# Patient Record
Sex: Male | Born: 1970 | Race: White | Hispanic: No | Marital: Married | Smoking: Never smoker
Health system: Southern US, Community
[De-identification: ages and names within clinical notes are randomized; demographics above are authoritative.]

## PROBLEM LIST (undated history)

## (undated) DIAGNOSIS — I10 Essential (primary) hypertension: Secondary | ICD-10-CM

## (undated) DIAGNOSIS — I341 Nonrheumatic mitral (valve) prolapse: Secondary | ICD-10-CM

## (undated) DIAGNOSIS — F419 Anxiety disorder, unspecified: Secondary | ICD-10-CM

## (undated) HISTORY — DX: Anxiety disorder, unspecified: F41.9

## (undated) HISTORY — DX: Essential (primary) hypertension: I10

## (undated) HISTORY — PX: HERNIA REPAIR: SHX51

## (undated) HISTORY — DX: Nonrheumatic mitral (valve) prolapse: I34.1

---
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
## 2010-10-21 DIAGNOSIS — B370 Candidal stomatitis: Secondary | ICD-10-CM

## 2010-10-31 ENCOUNTER — Ambulatory Visit (INDEPENDENT_AMBULATORY_CARE_PROVIDER_SITE_OTHER): Payer: BC Managed Care – PPO | Admitting: Infectious Disease

## 2010-10-31 ENCOUNTER — Encounter: Payer: Self-pay | Admitting: Infectious Disease

## 2010-10-31 DIAGNOSIS — B370 Candidal stomatitis: Secondary | ICD-10-CM

## 2010-10-31 DIAGNOSIS — B372 Candidiasis of skin and nail: Secondary | ICD-10-CM

## 2010-10-31 DIAGNOSIS — I10 Essential (primary) hypertension: Secondary | ICD-10-CM

## 2010-10-31 DIAGNOSIS — R61 Generalized hyperhidrosis: Secondary | ICD-10-CM

## 2010-10-31 MED ORDER — FLUCONAZOLE 100 MG PO TABS: 100.0000 mg | ORAL_TABLET | Freq: Every day | ORAL | Status: AC

## 2010-10-31 NOTE — Assessment & Plan Note (Signed)
Again I wonder about undiagnosed malignancy and aberrant immune response because of this. Will check LDH. Watch closely if he has more systemic symptoms will have low thresh hold to do CT chest abdomen adn pelvis

## 2010-10-31 NOTE — Assessment & Plan Note (Signed)
Recurrent. Will test for HIV, ab and RNA. Will test immunoglobulins adn IgE, CD4 count, myeloperoxidase leve. I have sent fungal culture from tongue. . I wonder if he has an undiangosed malignancy given the history of night sweats, and I will check an LDH. I will give him a 14 day course of fluconazole with refills

## 2010-10-31 NOTE — Assessment & Plan Note (Signed)
See above. 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 See above assessement and plan

## 2010-10-31 NOTE — Progress Notes (Signed)
Subjective:    Patient ID: Jesse Stevenson, male    DOB: [birth year], 40 y.o.   MRN: [id number]  HPI 40 year old Caucasian male who [facility] white coating on tongue and was given magic mouth wash with resolution. Then in early August in [facility] again with thrush. Was seen at a Clinic who gave magic moutwash. The pt went to go see Dr. Patsy Lager. She rx clotrimazole troche. Then a few weeks ago had another recurrence of this a few weeks ago. He tested negative for HIV and apparently had unremarkable cbc and bmp. He has had a notable history of drenching night sweats for the past few months which he attributes to the type of mattress he had and the themostat setting.  He is married and in a monogamous relationship with his wife. He has children. 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 He has travelled to [place], and [place], [place]. HE has had no TB exposures. He has no history of IVDU or other illicti recreational drug use. IN all I spent over 60 minutes with the patient including greater than 50% of time in face to face counselling of the patient and in coordination of care.   Review of Systems  Constitutional: Positive for diaphoresis. Negative for fever, chills, activity change, appetite change, fatigue and unexpected weight change.  HENT: Positive for trouble swallowing. Negative for congestion, sore throat, rhinorrhea, sneezing and sinus pressure.   Eyes: Negative for photophobia and visual disturbance.  Respiratory: Negative for cough, chest tightness, shortness of breath, wheezing and stridor.   Cardiovascular: Negative for chest pain, palpitations and leg swelling.  Gastrointestinal: Negative for nausea, vomiting, abdominal pain, diarrhea, constipation, blood in stool, abdominal distention and anal bleeding.  Genitourinary: Negative for dysuria, hematuria, flank pain and difficulty urinating.  Musculoskeletal: Negative for  myalgias, back pain, joint swelling, arthralgias and gait problem.  Skin: Negative for color change, pallor, rash and wound.  Neurological: Negative for dizziness, tremors, weakness and light-headedness.  Hematological: Negative for adenopathy. Does not bruise/bleed easily.  Psychiatric/Behavioral: Negative for behavioral problems, confusion, sleep disturbance, dysphoric mood, decreased concentration and agitation.       Objective:   Physical Exam  Constitutional: He is oriented to person, place, and time. He appears well-developed and well-nourished. No distress.  HENT:  Head: Normocephalic and atraumatic.  Mouth/Throat: Oropharynx is clear and moist.         thrush  Eyes: Conjunctivae and EOM are normal. Pupils are equal, round, and reactive to light. No scleral icterus.  Neck: Normal range of motion. Neck supple. No JVD present.  Cardiovascular: Normal rate, regular rhythm and normal heart sounds.  Exam reveals no gallop and no friction rub.   No murmur heard. Pulmonary/Chest: Effort normal and breath sounds normal. No respiratory distress. He has no wheezes. He has no rales. He exhibits no tenderness.  Abdominal: He exhibits no distension and no mass. There is no tenderness. There is no rebound and no guarding.  Musculoskeletal: He exhibits no edema and no tenderness.  Lymphadenopathy:    He has no cervical adenopathy.  Neurological: He is alert and oriented to person, place, and time. He has normal reflexes. He exhibits normal muscle tone. Coordination normal.  Skin: Skin is warm and dry. He is not diaphoretic. No erythema. No pallor.  Psychiatric: He has a normal mood and affect. His behavior is normal. Judgment and thought content normal.  Assessment & Plan:  Oral thrush Recurrent. Will test for HIV, ab and RNA. Will test immunoglobulins adn IgE, CD4 count, myeloperoxidase leve. I have sent fungal culture from tongue. . I wonder if he has an undiangosed malignancy  given the history of night sweats, and I will check an LDH. I will give him a 14 day course of fluconazole with refills  Mucocutaneous candidiasis See above. 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 See above assessement and plan  Night sweats Again I wonder about undiagnosed malignancy and aberrant immune response because of this. Will check LDH. Watch closely if he has more systemic symptoms will have low thresh hold to do CT chest abdomen adn pelvis

## 2010-11-01 LAB — COMPLETE METABOLIC PANEL WITH GFR
AST: 13 U/L (ref 0–37)
Alkaline Phosphatase: 51 U/L (ref 39–117)
BUN: 18 mg/dL (ref 6–23)
Calcium: 9.5 mg/dL (ref 8.4–10.5)
Chloride: 102 mEq/L (ref 96–112)
Creat: 0.86 mg/dL (ref 0.50–1.35)
Total Bilirubin: 0.4 mg/dL (ref 0.3–1.2)

## 2010-11-01 LAB — HIV 1/2 CONFIRMATION

## 2010-11-01 LAB — CBC WITH DIFFERENTIAL/PLATELET
Eosinophils Relative: 1 % (ref 0–5)
Hemoglobin: 16 g/dL (ref 13.0–17.0)
Lymphocytes Relative: 21 % (ref 12–46)
Lymphs Abs: 1.3 10*3/uL (ref 0.7–4.0)
MCV: 89.2 fL (ref 78.0–100.0)
Platelets: 267 10*3/uL (ref 150–400)
RBC: 5.47 MIL/uL (ref 4.22–5.81)
WBC: 6.2 10*3/uL (ref 4.0–10.5)

## 2010-11-01 LAB — IGG, IGA, IGM
IgA: 137 mg/dL (ref 68–379)
IgG (Immunoglobin G), Serum: 745 mg/dL (ref 650–1600)
IgM, Serum: 66 mg/dL (ref 41–251)

## 2010-11-02 LAB — CULTURE, GROUP A STREP: Organism ID, Bacteria: NORMAL

## 2010-11-07 ENCOUNTER — Telehealth: Payer: Self-pay | Admitting: *Deleted

## 2010-11-07 NOTE — Telephone Encounter (Signed)
He wanted to know the results of the labs. I gave them to him except the hiv labs. To md to handle. I told him alll labs looked good of the ones that are back

## 2010-11-08 ENCOUNTER — Encounter: Payer: Self-pay | Admitting: *Deleted

## 2010-11-08 NOTE — Telephone Encounter (Signed)
He needs to come to his folowup appt

## 2010-11-08 NOTE — Telephone Encounter (Signed)
Home # is d/c'd. I called the work # & he does not work there. Sent a letter

## 2010-11-28 ENCOUNTER — Telehealth: Payer: Self-pay | Admitting: *Deleted

## 2010-11-28 NOTE — Telephone Encounter (Signed)
I sent him a letter asking him to call for an appt. Needed to see md to discuss results & labs that were not in when I first spoke with him. Wrong address as mail was returned. Will wait for him to call

## 2010-11-29 LAB — FUNGUS CULTURE W SMEAR: Smear Result: NONE SEEN

## 2011-03-14 ENCOUNTER — Encounter: Payer: Self-pay | Admitting: Family Medicine

## 2011-03-14 ENCOUNTER — Ambulatory Visit: Payer: BC Managed Care – PPO

## 2011-03-14 ENCOUNTER — Ambulatory Visit (INDEPENDENT_AMBULATORY_CARE_PROVIDER_SITE_OTHER): Payer: BC Managed Care – PPO | Admitting: Family Medicine

## 2011-03-14 DIAGNOSIS — B372 Candidiasis of skin and nail: Secondary | ICD-10-CM

## 2011-03-14 DIAGNOSIS — R509 Fever, unspecified: Secondary | ICD-10-CM

## 2011-03-14 DIAGNOSIS — R059 Cough, unspecified: Secondary | ICD-10-CM

## 2011-03-14 DIAGNOSIS — B370 Candidal stomatitis: Secondary | ICD-10-CM

## 2011-03-14 DIAGNOSIS — I10 Essential (primary) hypertension: Secondary | ICD-10-CM

## 2011-03-14 DIAGNOSIS — R61 Generalized hyperhidrosis: Secondary | ICD-10-CM

## 2011-03-14 DIAGNOSIS — R05 Cough: Secondary | ICD-10-CM

## 2011-03-14 LAB — POCT CBC
Granulocyte percent: 72.8 %G (ref 37–80)
HCT, POC: 50.9 % (ref 43.5–53.7)
Hemoglobin: 16.4 g/dL (ref 14.1–18.1)
Lymph, poc: 0.7 (ref 0.6–3.4)
MCH, POC: 28.5 pg (ref 27–31.2)
MCHC: 32.2 g/dL (ref 31.8–35.4)
MCV: 88.4 fL (ref 80–97)
MID (cbc): 0.7 (ref 0–0.9)
MPV: 8.9 fL (ref 0–99.8)
POC Granulocyte: 3.8 (ref 2–6.9)
POC LYMPH PERCENT: 14.4 %L (ref 10–50)
POC MID %: 12.8 % — AB (ref 0–12)
Platelet Count, POC: 213 10*3/uL (ref 142–424)
RBC: 5.76 M/uL (ref 4.69–6.13)
RDW, POC: 13.2 %
WBC: 5.2 10*3/uL (ref 4.6–10.2)

## 2011-03-14 MED ORDER — DOXYCYCLINE HYCLATE 100 MG PO TABS: 100.0000 mg | ORAL_TABLET | Freq: Two times a day (BID) | ORAL | Status: AC

## 2011-03-14 MED ORDER — OSELTAMIVIR PHOSPHATE 75 MG PO CAPS: 75.0000 mg | ORAL_CAPSULE | Freq: Every day | ORAL | Status: AC

## 2011-03-14 NOTE — Progress Notes (Signed)
  Subjective:    Patient ID: Jesse Stevenson, male    DOB: [birth year], 41 y.o.   MRN: [id number]  Cough This is a chronic problem. The current episode started 1 to 4 weeks ago. The problem has been unchanged. The cough is non-productive. Associated symptoms include a fever, nasal congestion, postnasal drip, rhinorrhea, shortness of breath and sweats. Pertinent negatives include no chest pain, chills, ear congestion, ear pain, myalgias, rash or wheezing. The symptoms are aggravated by nothing.  Sore Throat  Associated symptoms include congestion, coughing and shortness of breath. Pertinent negatives include no ear pain.  Fever  Associated symptoms include congestion and coughing. Pertinent negatives include no chest pain, ear pain, rash or wheezing.  Chest Pain  Associated symptoms include a cough, dizziness, a fever and shortness of breath. Pertinent negatives include no back pain.      Review of Systems  Constitutional: Positive for fever and fatigue. Negative for chills.  HENT: Positive for congestion, rhinorrhea and postnasal drip. Negative for ear pain.   Respiratory: Positive for cough, chest tightness and shortness of breath. Negative for wheezing.   Cardiovascular: Negative for chest pain.  Genitourinary: Negative for dysuria, hematuria and flank pain.  Musculoskeletal: Negative for myalgias and back pain.  Skin: Negative for rash.  Neurological: Positive for dizziness. Negative for syncope.  Psychiatric/Behavioral: Negative for behavioral problems and agitation.       Objective:   Physical Exam  Constitutional: He is oriented to person, place, and time. He appears well-developed and well-nourished.  HENT:  Head: Normocephalic.  Eyes: EOM are normal. Pupils are equal, round, and reactive to light. Right eye exhibits no discharge. Left eye exhibits no discharge.  Neck: Normal range of motion. Neck supple.  Cardiovascular: Regular rhythm and normal heart sounds.  Exam  reveals no gallop and no friction rub.   No murmur heard. Pulmonary/Chest: Effort normal and breath sounds normal.  Abdominal: Soft. Bowel sounds are normal.  Musculoskeletal: Normal range of motion.  Neurological: He is alert and oriented to person, place, and time.  Skin: Skin is warm and dry.  Psychiatric: He has a normal mood and affect.          Assessment & Plan:  1. Fever-Pt was given Tamiflu ppx due to sone with + Flu B 2. Cough-Xray showed questionable density,since x 4 weeks will give Doxycycline 100 mg BID x 10 Days.  3. Hypotension with tachycardia-most likely related to dehydration with poor po since not feeling well and taking his BP meds today.  4. F/u in 1 week if no improvement, sooner if worse  [facility] reading (PRIMARY) by  Dr. Conley Rolls. Increased vascularature. Questionable right mid lobe density.

## 2011-03-14 NOTE — Patient Instructions (Signed)
Discharge Instructions Browse by Alphabet A B C D E F G H I J K L M N O P Q R S T U V W X Y Z  Browse by Category All Documents Allergy and Immunology Anesthesiology Bariatrics Bioterrorism Cardiology Critical Care Dentistry Dermatology Diabetes Dietary Easy-to-Read Emergency Medicine Endocrinology ENT Family Medicine Forms Gastroenterology Geriatrics Infectious Disease Internal Medicine Labs and Tests Neonatology Nephrology Neurology Obstetrics and Gynecology Oncology Ophthalmology Orthopedics Pediatrics Pharmacology Physical Medicine and Rehabilitation Podiatry Preventative Medicine Procedures Psychiatry Pulmonary Medicine Radiology Rheumatology Surgery Urology Drug Information Sheets All Drug Information Sheets  Browse by Alphabet A B C D E F G H I J K L M N O P Q R S T U V W X Y Z Discharge Instructions Browse by Alphabet A B C D E F G H I J K L M N O P Q R S T U V W X Y Z  Browse by Category All Documents Allergy and Immunology Anesthesiology [facility] Bioterrorism Cardiology Critical Care Dentistry Dermatology Diabetes Dietary Easy-to-Read Emergency Medicine Endocrinology ENT Family Medicine Forms Gastroenterology Geriatrics Infectious Disease Internal Medicine Labs and Tests Neonatology Nephrology Neurology Obstetrics and Gynecology Oncology Ophthalmology Orthopedics Pediatrics Pharmacology Physical Medicine and Rehabilitation Podiatry Preventative Medicine Procedures Psychiatry Pulmonary Medicine Radiology Rheumatology Surgery Urology Drug Information Sheets All Drug Information Sheets  Browse by Alphabet A B C D E F G H I J K L M N O P Q R S T U V W X Y Z

## 2011-03-16 ENCOUNTER — Telehealth: Payer: Self-pay

## 2011-03-16 NOTE — Telephone Encounter (Signed)
Pt call and states he was not feeling better and has congestion.  Pt did not get rx for doxy.  [facility] to [facility] up

## 2011-03-16 NOTE — Telephone Encounter (Addendum)
.  [facility] PT WAS SEEN THE OTHER DAY FOR THE FLU AND ISN'T MUCH BETTER. WOULD LIKE TO SPEAK WITH SOMEONE. PLEASE CALL [phone] HIS PHARMACY IS [facility] ON [address]

## 2011-06-20 ENCOUNTER — Encounter: Payer: Self-pay | Admitting: Family Medicine

## 2011-06-20 ENCOUNTER — Ambulatory Visit (INDEPENDENT_AMBULATORY_CARE_PROVIDER_SITE_OTHER): Payer: BC Managed Care – PPO | Admitting: Family Medicine

## 2011-06-20 VITALS — BP 110/90 | HR 97 | Temp 98.1°F | Resp 16 | Ht 76.0 in | Wt 187.0 lb

## 2011-06-20 DIAGNOSIS — M419 Scoliosis, unspecified: Secondary | ICD-10-CM | POA: Insufficient documentation

## 2011-06-20 DIAGNOSIS — Z0000 Encounter for general adult medical examination without abnormal findings: Secondary | ICD-10-CM

## 2011-06-20 DIAGNOSIS — M4120 Other idiopathic scoliosis, site unspecified: Secondary | ICD-10-CM

## 2011-06-20 DIAGNOSIS — I10 Essential (primary) hypertension: Secondary | ICD-10-CM

## 2011-06-20 DIAGNOSIS — R292 Abnormal reflex: Secondary | ICD-10-CM

## 2011-06-20 LAB — BASIC METABOLIC PANEL
BUN: 15 mg/dL (ref 6–23)
CO2: 29 mEq/L (ref 19–32)
Calcium: 10 mg/dL (ref 8.4–10.5)
Chloride: 100 mEq/L (ref 96–112)
Creat: 0.84 mg/dL (ref 0.50–1.35)
Glucose, Bld: 90 mg/dL (ref 70–99)
Potassium: 4.7 mEq/L (ref 3.5–5.3)
Sodium: 139 mEq/L (ref 135–145)

## 2011-06-20 LAB — POCT URINALYSIS DIPSTICK
Ketones, UA: NEGATIVE
Protein, UA: NEGATIVE
Spec Grav, UA: 1.02
Urobilinogen, UA: 0.2
pH, UA: 7.5

## 2011-06-20 LAB — T4, FREE: Free T4: 0.87 ng/dL (ref 0.80–1.80)

## 2011-06-20 LAB — TSH: TSH: 1.69 u[IU]/mL (ref 0.350–4.500)

## 2011-06-20 MED ORDER — LISINOPRIL-HYDROCHLOROTHIAZIDE 20-25 MG PO TABS: 1.0000 | ORAL_TABLET | Freq: Every day | ORAL | Status: DC

## 2011-06-20 MED ORDER — CARISOPRODOL 350 MG PO TABS: 350.0000 mg | ORAL_TABLET | Freq: Two times a day (BID) | ORAL | Status: DC | PRN

## 2011-06-20 MED ORDER — CARISOPRODOL 350 MG PO TABS: 350.0000 mg | ORAL_TABLET | Freq: Three times a day (TID) | ORAL | Status: DC | PRN

## 2011-06-20 NOTE — Progress Notes (Signed)
  Subjective:    Patient ID: Jesse Stevenson, male    DOB: [birth year], 41 y.o.   MRN: [id number]  HPI This Cauc male is here for annual CPE; he has HTN x 3 years and scoliosis with chronic  intermittent back pain and muscle spasms. He is married with 2 children, is a nonsmoker/ occasional  alcohol user and exercises sometimes. He works at a [employer], product/process development.    Last eye exam: 2-3 years ago (wears glasses)  Last dental exam: 03/2011   Review of Systems  Constitutional: Negative.   HENT: Negative.   Eyes: Negative.   Respiratory: Negative.   Cardiovascular: Negative.   Gastrointestinal: Negative.   Genitourinary: Negative.   Musculoskeletal: Positive for back pain.  Neurological: Negative.   Hematological: Negative.   Psychiatric/Behavioral: Negative.        Objective:   Physical Exam  Nursing note and vitals reviewed. Constitutional: He is oriented to person, place, and time. He appears well-developed and well-nourished. No distress.  HENT:  Head: Normocephalic and atraumatic.  Right Ear: External ear normal.  Left Ear: External ear normal.  Nose: Nose normal.  Mouth/Throat: Oropharynx is clear and moist.  Eyes: Conjunctivae and EOM are normal. Pupils are equal, round, and reactive to light. No scleral icterus.  Neck: Normal range of motion. Neck supple. No thyromegaly present.  Cardiovascular: Normal rate, regular rhythm, normal heart sounds and intact distal pulses.  Exam reveals no gallop and no friction rub.   No murmur heard. Pulmonary/Chest: Effort normal and breath sounds normal. No respiratory distress. He exhibits no tenderness.  Abdominal: Soft. Bowel sounds are normal. He exhibits no mass. There is tenderness. There is no rebound and no guarding. Hernia confirmed negative in the right inguinal area and confirmed negative in the left inguinal area.       Mild LUQ tenderness with moderate palpation  Genitourinary: Testes normal and penis normal. Right testis  shows no swelling and no tenderness. Right testis is descended. Left testis shows no swelling and no tenderness. Left testis is descended. Circumcised.  Musculoskeletal: Normal range of motion. He exhibits no edema and no tenderness.       Thoracic spine with mild curvature to right -xray (09/26/10) Shows scoliosis convex rightward, ~ 10 degrees  Lymphadenopathy:    He has no cervical adenopathy.       Right: No inguinal adenopathy present.       Left: No inguinal adenopathy present.  Neurological: He is alert and oriented to person, place, and time. He displays abnormal reflex. No cranial nerve deficit. He exhibits normal muscle tone.       DTRs upper ext: 3+/ brisk; lower ext 2+  Skin: Skin is warm and dry. No erythema.  Psychiatric: He has a normal mood and affect. His behavior is normal. Judgment and thought content normal.    ECG 2009 scanned into EMR Labs:  09/22/2010-  Cr= 0.84  LFTs normal    CBC normal            06/09/2010-  TChol= 196   TGs= 48 HDL= 75   LDL= 111     Assessment & Plan:   1. Routine general medical examination at a health care facility  Basic metabolic panel, Vitamin D, 25-hydroxy, POCT urinalysis dipstick  2. HTN (hypertension)  RF: Lisinopril-HCTZ   #90  With 3RFs RTC 4 months for HTN follow-up  3. Hyperreflexia  TSH, T4, Free  4. Scoliosis  RF  [place]

## 2011-06-20 NOTE — Patient Instructions (Signed)
Testicular Problems and Self-Exam Men can examine themselves easily and effectively with positive results. Monthly exams detect problems early and save lives. There are numerous causes of swelling in the testicle. Testicular cancer usually appears as a firm painless lump in the front part of the testicle. This may feel like a dull ache or heavy feeling located in the lower abdomen (belly), groin, or scrotum.  The risk is greater in men with undescended testicles and it is more common in young men. It is responsible for almost a fifth of cancers in males between ages 58 and 48. Other common causes of swellings, lumps, and testicular pain include injuries, inflammation (soreness) from infection, hydrocele, and torsion. These are a few of the reasons to do monthly self-examination of the testicles. The exam only takes minutes and could add years to your life. Get in the habit! SELF-EXAMINATION OF THE TESTICLES The testicles are easiest to examine after warm baths or showers and are more difficult to examine when you are cold. This is because the muscles attached to the testicles retract and pull them up higher or into the abdomen. While standing, roll one testicle between the thumb and forefinger. Feel for lumps, swelling, or discomfort. A normal testicle is egg shaped and feels firm. It is smooth and not tender. The spermatic cord can be felt as a firm spaghetti-like cord at the back of the testicle. It is also important to examine your groins. This is the crease between the front of your leg and your abdomen. Also, feel for enlarged lymph nodes (glands). Enlarged nodes are also a cause for you to see your caregiver for evaluation.  Self-examination of the testicles and groin areas on a regular basis will help you to know what your own testicles and groins feel like. This will help you pick up an abnormality (difference) at an earlier stage. Early discovery is the key to curing this cancer or treating other  conditions. Any lump, change, or swelling in the testicle calls for immediate evaluation by your caregiver. Cancer of the testicle does not result in impotence and it does not prevent normal intercourse or prevent having children. If your caregiver feels that medical treatment or chemotherapy could lead to infertility, sperm can be frozen for future use. It is necessary to see a caregiver as soon as possible after the discovery of a lump in a testicle. Document Released: 05/08/2000 Document Revised: 01/19/2011 Document Reviewed: 02/01/2008 ExitCare Patient Information 2012 ExitCare, [facility]     Hypertension As your heart beats, it forces blood through your arteries. This force is your blood pressure. If the pressure is too high, it is called hypertension (HTN) or high blood pressure. HTN is dangerous because you may have it and not know it. High blood pressure may mean that your heart has to work harder to pump blood. Your arteries may be narrow or stiff. The extra work puts you at risk for heart disease, stroke, and other problems.  Blood pressure consists of two numbers, a higher number over a lower, 110/72, for example. It is stated as "110 over 72." The ideal is below 120 for the top number (systolic) and under 80 for the bottom (diastolic). Write down your blood pressure today. You should pay close attention to your blood pressure if you have certain conditions such as:  Heart failure.   Prior heart attack.   Diabetes   Chronic kidney disease.   Prior stroke.   Multiple risk factors for heart disease.  To  see if you have HTN, your blood pressure should be measured while you are seated with your arm held at the level of the heart. It should be measured at least twice. A one-time elevated blood pressure reading (especially in the Emergency Department) does not mean that you need treatment. There may be conditions in which the blood pressure is different between your right and left arms. It is  important to see your caregiver soon for a recheck. Most people have essential hypertension which means that there is not a specific cause. This type of high blood pressure may be lowered by changing lifestyle factors such as:  Stress.   Smoking.   Lack of exercise.   Excessive weight.   Drug/tobacco/alcohol use.   Eating less salt.  Most people do not have symptoms from high blood pressure until it has caused damage to the body. Effective treatment can often prevent, delay or reduce that damage. TREATMENT  When a cause has been identified, treatment for high blood pressure is directed at the cause. There are a large number of medications to treat HTN. These fall into several categories, and your caregiver will help you select the medicines that are best for you. Medications may have side effects. You should review side effects with your caregiver. If your blood pressure stays high after you have made lifestyle changes or started on medicines,   Your medication(s) may need to be changed.   Other problems may need to be addressed.   Be certain you understand your prescriptions, and know how and when to take your medicine.   Be sure to follow up with your caregiver within the time frame advised (usually within two weeks) to have your blood pressure rechecked and to review your medications.   If you are taking more than one medicine to lower your blood pressure, make sure you know how and at what times they should be taken. Taking two medicines at the same time can result in blood pressure that is too low.  SEEK IMMEDIATE MEDICAL CARE IF:  You develop a severe headache, blurred or changing vision, or confusion.   You have unusual weakness or numbness, or a faint feeling.   You have severe chest or abdominal pain, vomiting, or breathing problems.  MAKE SURE YOU:   Understand these instructions.   Will watch your condition.   Will get help right away if you are not doing well or  get worse.  Document Released: 01/30/2005 Document Revised: 01/19/2011 Document Reviewed: 09/20/2007 [facility] Patient Information 2012 [place], [place].     Marland KitchenKeeping you healthy  Get these tests  Blood pressure- Have your blood pressure checked once a year by your healthcare provider.  Normal blood pressure is 120/80.  Weight- Have your body mass index (BMI) calculated to screen for obesity.  BMI is a measure of body fat based on height and weight. You can also calculate your own BMI at https://www.west-esparza.com/.  Cholesterol- Have your cholesterol checked regularly starting at age 10, sooner may be necessary if you have diabetes, high blood pressure, if a family member developed heart diseases at an early age or if you smoke.   Chlamydia, HIV, and other sexual transmitted disease- Get screened each year until the age of 53 then within three months of each new sexual partner.  Diabetes- Have your blood sugar checked regularly if you have high blood pressure, high cholesterol, a family history of diabetes or if you are overweight.  Get these vaccines  Flu shot-  Every fall.  Tetanus shot- Every 10 years.  Menactra- Single dose; prevents meningitis.  Take these steps  Don't smoke- If you do smoke, ask your healthcare provider about quitting. For tips on how to quit, go to www.smokefree.gov or call 1-800-QUIT-NOW.  Be physically active- Exercise 5 days a week for at least 30 minutes.  If you are not already physically active start slow and gradually work up to 30 minutes of moderate physical activity.  Examples of moderate activity include walking briskly, mowing the yard, dancing, swimming bicycling, etc.  Eat a healthy diet- Eat a variety of healthy foods such as fruits, vegetables, low fat milk, low fat cheese, yogurt, lean meats, poultry, fish, beans, tofu, etc.  For more information on healthy eating, go to www.thenutritionsource.org  Drink alcohol in moderation- Limit alcohol  intake two drinks or less a day.  Never drink and drive.  Dentist- Brush and floss teeth twice daily; visit your dentis twice a year.  Depression-Your emotional health is as important as your physical health.  If you're feeling down, losing interest in things you normally enjoy please talk with your healthcare provider.  Gun Safety- If you keep a gun in your home, keep it unloaded and with the safety lock on.  Bullets should be stored separately.  Helmet use- Always wear a helmet when riding a motorcycle, bicycle, rollerblading or skateboarding.  Safe sex- If you may be exposed to a sexually transmitted infection, use a condom  Seat belts- Seat bels can save your life; always wear one.  Smoke/Carbon Monoxide detectors- These detectors need to be installed on the appropriate level of your home.  Replace batteries at least once a year.  Skin Cancer- When out in the sun, cover up and use sunscreen SPF 15 or higher.  Violence- If anyone is threatening or hurting you, please tell your healthcare provider.

## 2011-06-25 NOTE — Progress Notes (Signed)
Quick Note:  Please call pt and advise that the following labs are abnormal...  All labs are normal except Vit D is a little low. Get an OTC Multivitamin for Men that has Vitamin D (One- A- Day or [employer] Made are good brands). Eat as healthy as possible to get most of your nutrients from foods.   Copy to pt. ______

## 2011-06-26 ENCOUNTER — Encounter: Payer: Self-pay | Admitting: Radiology

## 2011-06-28 ENCOUNTER — Encounter: Payer: Self-pay | Admitting: Family Medicine

## 2011-10-24 ENCOUNTER — Ambulatory Visit (INDEPENDENT_AMBULATORY_CARE_PROVIDER_SITE_OTHER): Payer: BC Managed Care – PPO | Admitting: Family Medicine

## 2011-10-24 ENCOUNTER — Encounter: Payer: Self-pay | Admitting: Family Medicine

## 2011-10-24 VITALS — BP 129/90 | HR 98 | Temp 98.0°F | Resp 16 | Ht 75.5 in | Wt 190.0 lb

## 2011-10-24 DIAGNOSIS — R002 Palpitations: Secondary | ICD-10-CM

## 2011-10-24 DIAGNOSIS — I10 Essential (primary) hypertension: Secondary | ICD-10-CM

## 2011-10-24 MED ORDER — CARISOPRODOL 350 MG PO TABS: 350.0000 mg | ORAL_TABLET | Freq: Three times a day (TID) | ORAL | Status: DC | PRN

## 2011-10-24 NOTE — Patient Instructions (Signed)

## 2011-10-24 NOTE — Progress Notes (Signed)
S:This 41 y.o. Cauc male has HTN. He has no adverse effects with medications. He reports occasional palpitations mid to late morning several days a week. He admits to inadequate fluid intake, especially water; he prefers to drink Gatorade  and has limited his caffeine intake. Otherwise, he feels well.  ROS: noncontributory.  O:  Filed Vitals:   10/24/11 1406  BP: 129/90  Pulse: 98  Temp: 98 F (36.7 C)  Resp: 16   GEN: In NAD; WN,WD. COR: RRR, no m,g,r. Heart rate is >80. LUNGS: Normal resp rate and effort. NEURO: Nonfocal.  A/P: 1. HTN (hypertension)   Continue Lisinopril-HCTZ 20-25 mg  1 tab daily.  2. Palpitations  Encouraged hydration with water in addition to other liquids.

## 2012-04-10 ENCOUNTER — Telehealth: Payer: Self-pay | Admitting: *Deleted

## 2012-04-10 NOTE — Telephone Encounter (Signed)
[facility] requesting refill on soma 350mg  to put on pt profile for the next fill in march

## 2012-04-11 MED ORDER — CARISOPRODOL 350 MG PO TABS: 350.0000 mg | ORAL_TABLET | Freq: Three times a day (TID) | ORAL | Status: DC | PRN

## 2012-04-11 NOTE — Telephone Encounter (Signed)
Soma refill e-prescribed to pt's pharmacy.

## 2012-04-12 ENCOUNTER — Other Ambulatory Visit: Payer: Self-pay | Admitting: Family Medicine

## 2012-04-12 MED ORDER — CARISOPRODOL 350 MG PO TABS: 350.0000 mg | ORAL_TABLET | Freq: Three times a day (TID) | ORAL | Status: DC | PRN

## 2012-06-20 ENCOUNTER — Other Ambulatory Visit: Payer: Self-pay | Admitting: Family Medicine

## 2012-07-19 ENCOUNTER — Other Ambulatory Visit: Payer: Self-pay | Admitting: Family Medicine

## 2012-08-13 ENCOUNTER — Other Ambulatory Visit: Payer: Self-pay | Admitting: Physician Assistant

## 2012-08-20 ENCOUNTER — Ambulatory Visit (INDEPENDENT_AMBULATORY_CARE_PROVIDER_SITE_OTHER): Payer: BC Managed Care – PPO | Admitting: Family Medicine

## 2012-08-20 ENCOUNTER — Encounter: Payer: Self-pay | Admitting: Family Medicine

## 2012-08-20 VITALS — BP 115/80 | HR 109 | Temp 98.1°F | Resp 16 | Ht 75.5 in | Wt 189.0 lb

## 2012-08-20 DIAGNOSIS — R000 Tachycardia, unspecified: Secondary | ICD-10-CM

## 2012-08-20 DIAGNOSIS — Z760 Encounter for issue of repeat prescription: Secondary | ICD-10-CM

## 2012-08-20 DIAGNOSIS — I498 Other specified cardiac arrhythmias: Secondary | ICD-10-CM

## 2012-08-20 DIAGNOSIS — I10 Essential (primary) hypertension: Secondary | ICD-10-CM

## 2012-08-20 MED ORDER — CARISOPRODOL 350 MG PO TABS: 350.0000 mg | ORAL_TABLET | Freq: Three times a day (TID) | ORAL | Status: DC | PRN

## 2012-08-20 MED ORDER — LISINOPRIL-HYDROCHLOROTHIAZIDE 20-25 MG PO TABS: 1.0000 | ORAL_TABLET | Freq: Every day | ORAL | Status: DC

## 2012-08-20 NOTE — Progress Notes (Signed)
S:  This 42 y.o. Cauc male has HTN. He is compliant w/ medications w/o adverse effects; he has been w/o medication for 2 days due to denial of refill. He has an elevated heart rate which he attributes to mild dehydration. He spends a lot of time outside; his son plays baseball. Pt acknowledges that his liquid intake is inadequate. He denies palpitations, CP or tightness, edema, SOB or DOE, fatigue, diaphoresis, HA, weakness, dizziness, numbness or syncope.  Patient Active Problem List   Diagnosis Date Noted  . Scoliosis of thoracic spine 06/20/2011  . Mucocutaneous candidiasis 10/31/2010  . HTN (hypertension) 10/31/2010  . Night sweats 10/31/2010  . Oral thrush 10/21/2010    PMHx, Soc Hx and Fam Hx reviewed.  ROS: As per HPI.  O:  Filed Vitals:   08/20/12 1421  BP: 115/80  Pulse: 109  Temp: 98.1 F (36.7 C)  Resp: 16   GEN: In NAD; WN,WD. COR: Sinus tachycardia. Normal s1 and S2. No m/g/r/. LUNGS: CTA; no wheezes or rhonchi. Normal resp effort. SKIN: Ruddy complexion. No rashes or pallor. MS: MAEs; no c/c/e. NEURO: A&Ox 3; CNs intact. Nonfocal.  A/P: HTN (hypertension)- Stable and controlled on current medication. No med change at this time.  Sinus tachycardia- Suspect dehydration. Encourage 48-60 ounces of fluid daily.  Issue of repeat prescriptions  Meds ordered this encounter  Medications  . lisinopril-hydrochlorothiazide (PRINZIDE,ZESTORETIC) 20-25 MG per tablet    Sig: Take 1 tablet by mouth daily.    Dispense:  90 tablet    Refill:  1  . DISCONTD: carisoprodol (SOMA) 350 MG tablet    Sig: Take 1 tablet (350 mg total) by mouth 3 (three) times daily as needed.    Dispense:  65 tablet    Refill:  5  . carisoprodol (SOMA) 350 MG tablet    Sig: Take 1 tablet (350 mg total) by mouth 3 (three) times daily as needed.    Dispense:  65 tablet    Refill:  5

## 2012-10-03 IMAGING — CR DG CHEST 2V
2 series · 2 of 2 positions shown · non-contrast
Comparison: 09/26/2010

CLINICAL DATA: Cough and fever

CHEST - 2 VIEW

[PA]
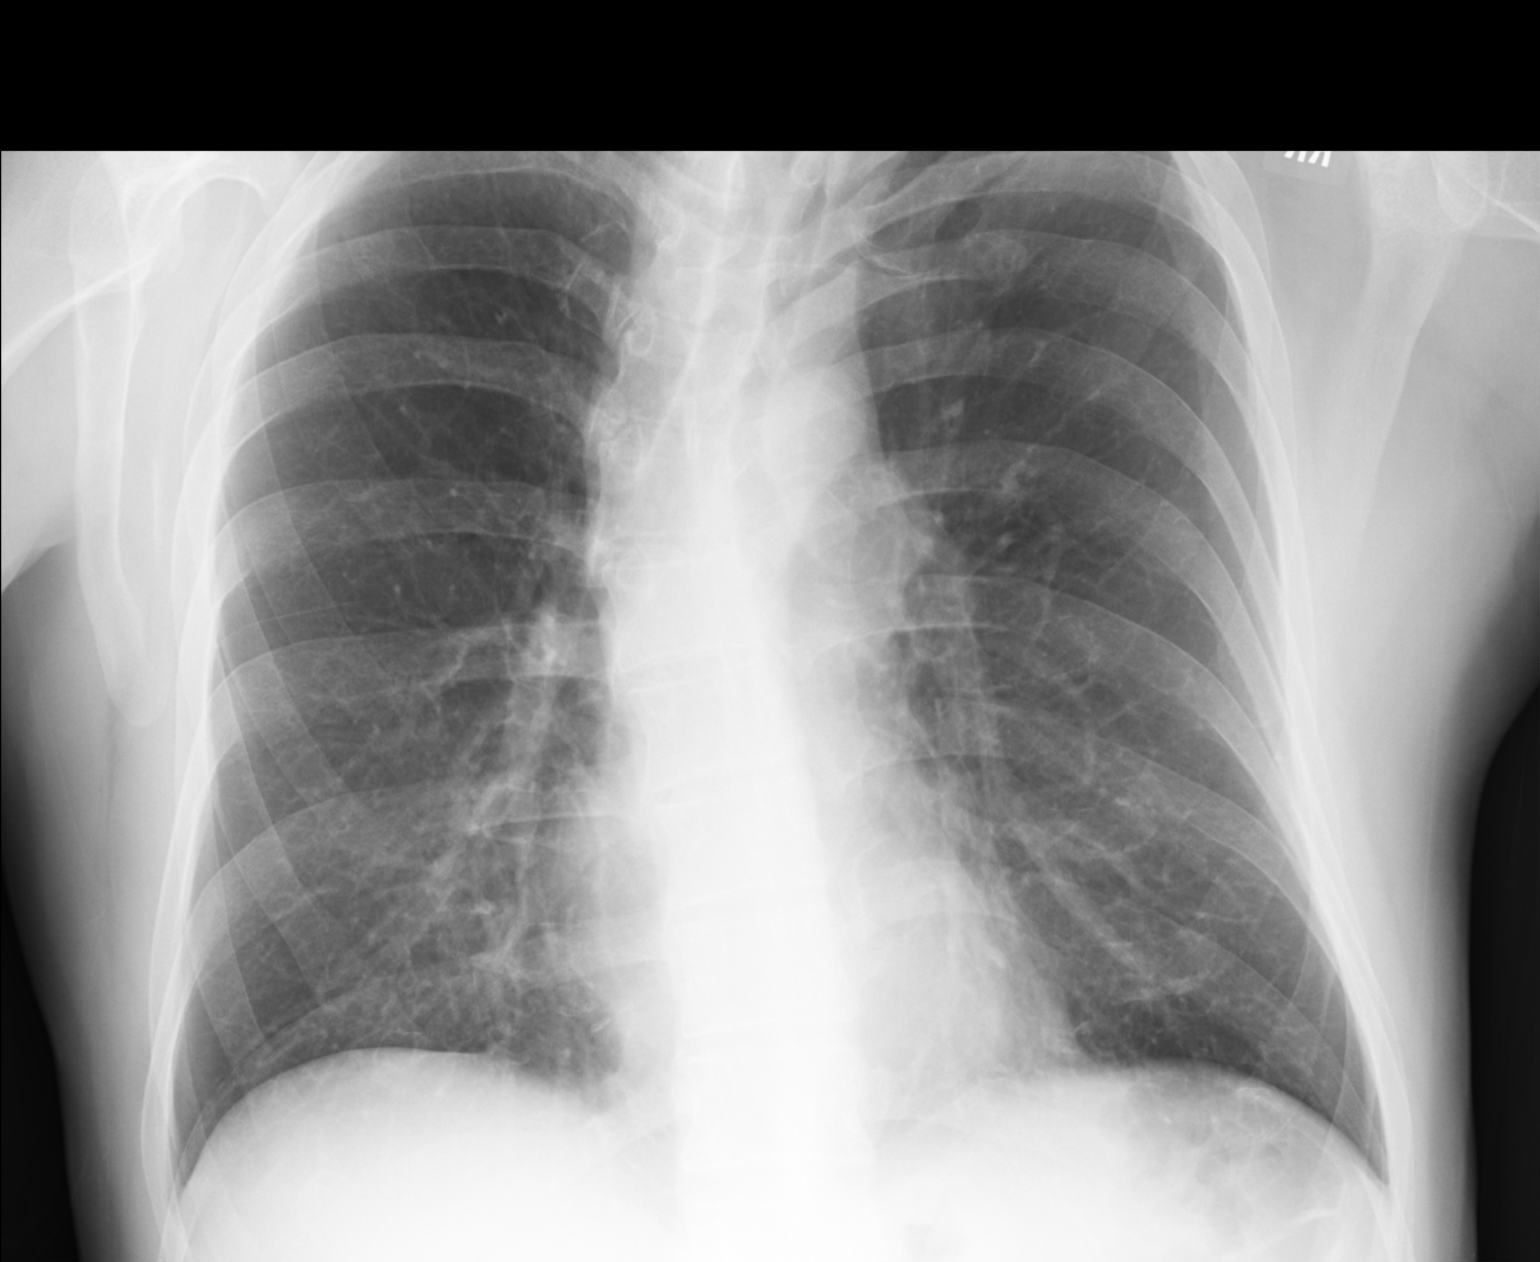

[lateral]
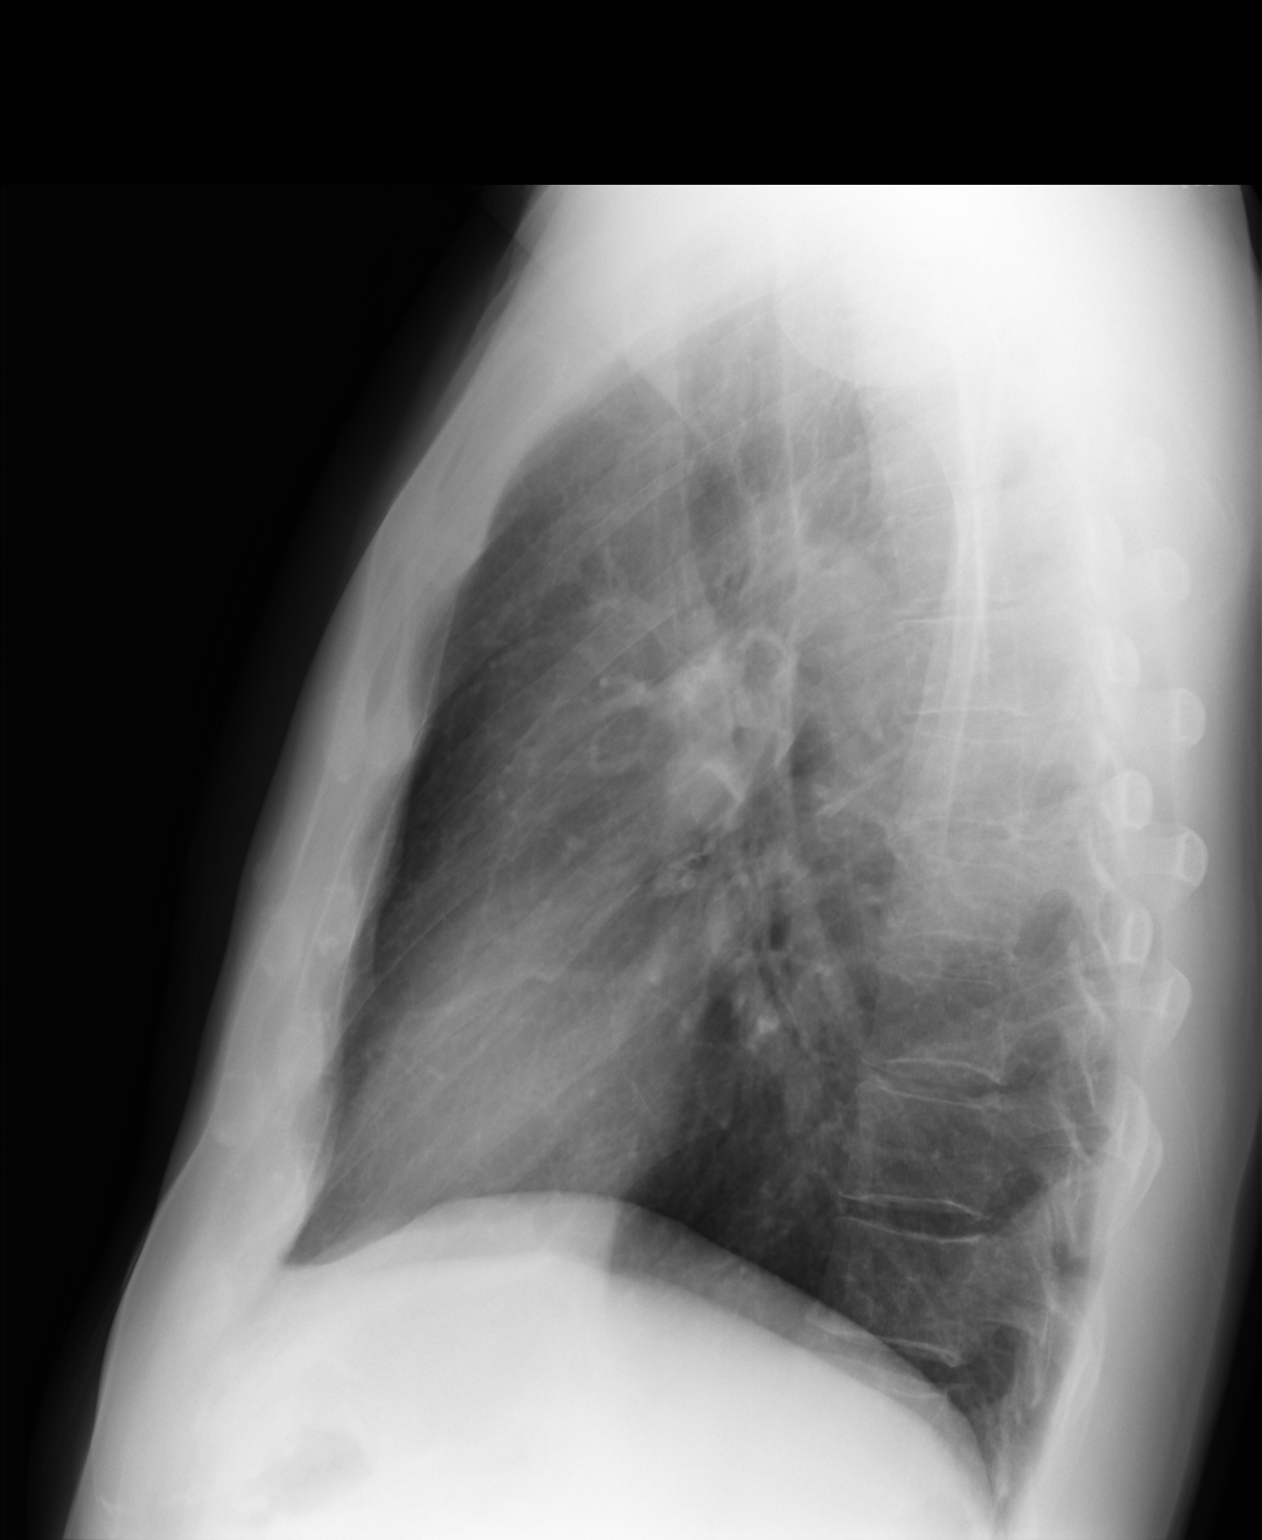

[2 of 2 positions shown; findings below may reference images not displayed]

FINDINGS: Mild dextroscoliosis of the thoracic spine as before,
without evident underlying vertebral anomaly.  Lungs clear.  Heart
size normal.  No effusion.  Stable pulmonary vascularity.
IMPRESSION: 1.  No acute disease

## 2012-10-15 ENCOUNTER — Ambulatory Visit (INDEPENDENT_AMBULATORY_CARE_PROVIDER_SITE_OTHER): Payer: BC Managed Care – PPO | Admitting: Family Medicine

## 2012-10-15 ENCOUNTER — Encounter: Payer: Self-pay | Admitting: Family Medicine

## 2012-10-15 VITALS — BP 140/86 | HR 106 | Temp 97.8°F | Resp 16 | Ht 75.5 in | Wt 193.0 lb

## 2012-10-15 DIAGNOSIS — Z0000 Encounter for general adult medical examination without abnormal findings: Secondary | ICD-10-CM

## 2012-10-15 DIAGNOSIS — I10 Essential (primary) hypertension: Secondary | ICD-10-CM

## 2012-10-15 LAB — POCT URINALYSIS DIPSTICK
Blood, UA: NEGATIVE
Nitrite, UA: NEGATIVE
Urobilinogen, UA: 1
pH, UA: 7

## 2012-10-15 MED ORDER — LISINOPRIL-HYDROCHLOROTHIAZIDE 20-25 MG PO TABS: 1.0000 | ORAL_TABLET | Freq: Every day | ORAL | Status: DC

## 2012-10-15 NOTE — Patient Instructions (Signed)
Keeping you healthy  Get these tests  Blood pressure- Have your blood pressure checked once a year by your healthcare provider.  Normal blood pressure is 120/80.  Weight- Have your body mass index (BMI) calculated to screen for obesity.  BMI is a measure of body fat based on height and weight. You can also calculate your own BMI at https://www.west-esparza.com/.  Cholesterol- Have your cholesterol checked regularly starting at age 43, sooner may be necessary if you have diabetes, high blood pressure, if a family member developed heart diseases at an early age or if you smoke.   Chlamydia, HIV, and other sexual transmitted disease- Get screened each year until the age of 15 then within three months of each new sexual partner.  Diabetes- Have your blood sugar checked regularly if you have high blood pressure, high cholesterol, a family history of diabetes or if you are overweight.  Get these vaccines  Flu shot- Every fall.  Tetanus shot- Every 10 years. You received Tdap vaccine in 2007; next dose of Tetanus due in 2017.  Menactra- Single dose; prevents meningitis.  Take these steps  Don't smoke- If you do smoke, ask your healthcare provider about quitting. For tips on how to quit, go to www.smokefree.gov or call 1-800-QUIT-NOW.  Be physically active- Exercise 5 days a week for at least 30 minutes.  If you are not already physically active start slow and gradually work up to 30 minutes of moderate physical activity.  Examples of moderate activity include walking briskly, mowing the yard, dancing, swimming bicycling, etc.  Eat a healthy diet- Eat a variety of healthy foods such as fruits, vegetables, low fat milk, low fat cheese, yogurt, lean meats, poultry, fish, beans, tofu, etc.  For more information on healthy eating, go to www.thenutritionsource.org  Drink alcohol in moderation- Limit alcohol intake two drinks or less a day.  Never drink and drive.  Dentist- Brush and floss teeth twice  daily; visit your dentis twice a year.  Depression-Your emotional health is as important as your physical health.  If you're feeling down, losing interest in things you normally enjoy please talk with your healthcare provider.  Gun Safety- If you keep a gun in your home, keep it unloaded and with the safety lock on.  Bullets should be stored separately.  Helmet use- Always wear a helmet when riding a motorcycle, bicycle, rollerblading or skateboarding.  Safe sex- If you may be exposed to a sexually transmitted infection, use a condom  Seat belts- Seat bels can save your life; always wear one.  Smoke/Carbon Monoxide detectors- These detectors need to be installed on the appropriate level of your home.  Replace batteries at least once a year.  Skin Cancer- When out in the sun, cover up and use sunscreen SPF 15 or higher.  Violence- If anyone is threatening or hurting you, please tell your healthcare provider.

## 2012-10-15 NOTE — Progress Notes (Signed)
Subjective:    Patient ID: Jesse Stevenson, male    DOB: May 27, 1970, 42 y.o.   MRN: [id number]  HPI  This 42 y.o. Cauc male is here for CPE. He has well controlled HTN and is compliant with   medications and activity. He reports mild dehydration today after spending 3 hours in the sun  yesterday at his son's baseball practice. He will return for fasting labs later this week.  Patient Active Problem List   Diagnosis Date Noted  . Scoliosis of thoracic spine 06/20/2011  . Mucocutaneous candidiasis 10/31/2010  . HTN (hypertension) 10/31/2010  . Night sweats 10/31/2010   PMHx, Soc Hx and Fam Hx reviewed.   Review of Systems  Musculoskeletal: Positive for back pain.       Scoliosis is treated by a Chiropractor.  All other systems reviewed and are negative.       Objective:   Physical Exam  Nursing note and vitals reviewed. Constitutional: He is oriented to person, place, and time. Vital signs are normal. He appears well-developed and well-nourished. No distress.  HENT:  Head: Normocephalic and atraumatic.  Right Ear: Hearing, external ear and ear canal normal. Tympanic membrane is scarred.  Left Ear: Hearing, external ear and ear canal normal. Tympanic membrane is scarred.  Nose: Nose normal. No mucosal edema, nasal deformity or septal deviation.  Mouth/Throat: Uvula is midline, oropharynx is clear and moist and mucous membranes are normal. No oral lesions. Normal dentition. No dental caries.  Eyes: Conjunctivae, EOM and lids are normal. Pupils are equal, round, and reactive to light. No scleral icterus.  Pt has vision evaluation every 2 years; wears corrective lenses.  Neck: Trachea normal, normal range of motion and full passive range of motion without pain. Neck supple. No hepatojugular reflux and no JVD present. No spinous process tenderness and no muscular tenderness present. No edema, no erythema and normal range of motion present. No mass and no thyromegaly present.   Cardiovascular: Regular rhythm, S1 normal, S2 normal, normal heart sounds, intact distal pulses and normal pulses.   No extrasystoles are present. Tachycardia present.  PMI is not displaced.  Exam reveals no gallop and no friction rub.   No murmur heard. Pulmonary/Chest: Effort normal and breath sounds normal. No respiratory distress. He has no wheezes. He exhibits no tenderness.  Abdominal: Soft. Normal appearance, normal aorta and bowel sounds are normal. He exhibits no distension, no pulsatile midline mass and no mass. There is no hepatosplenomegaly. There is no tenderness. There is no guarding and no CVA tenderness. No hernia.  Musculoskeletal:       Thoracic back: He exhibits tenderness, bony tenderness, deformity and spasm. He exhibits normal range of motion, no swelling and no pain.  Lymphadenopathy:       Head (right side): No submental, no submandibular, no tonsillar, no posterior auricular and no occipital adenopathy present.       Head (left side): No submental, no submandibular, no tonsillar, no posterior auricular and no occipital adenopathy present.    He has no cervical adenopathy.    He has no axillary adenopathy.       Right: No inguinal and no supraclavicular adenopathy present.       Left: No inguinal and no supraclavicular adenopathy present.  Neurological: He is alert and oriented to person, place, and time. He has normal strength. He is not disoriented. He displays no atrophy. No cranial nerve deficit or sensory deficit. He exhibits normal muscle tone. Coordination and gait normal.  Reflex Scores:      Bicep reflexes are 3+ on the right side and 3+ on the left side.      Patellar reflexes are 2+ on the right side and 2+ on the left side. Skin: Skin is warm, dry and intact. No abrasion, no lesion and no rash noted. He is not diaphoretic. No cyanosis or erythema. No pallor. Nails show no clubbing.  Psychiatric: He has a normal mood and affect. His speech is normal and behavior  is normal. Judgment and thought content normal. Cognition and memory are normal.    Results for orders placed in visit on 10/15/12  POCT URINALYSIS DIPSTICK      Result Value Range   Color, UA yellow     Clarity, UA clear     Glucose, UA neg     Bilirubin, UA neg     Ketones, UA ntrace     Spec Grav, UA 1.020     Blood, UA neg     pH, UA 7.0     Protein, UA 30     Urobilinogen, UA 1.0     Nitrite, UA neg     Leukocytes, UA Negative          Assessment & Plan:  Routine general medical examination at a health care facility - Plan: POCT urinalysis dipstick, Comprehensive metabolic panel, Lipid panel  HTN (hypertension) - Stable and controlled on current medication.  Advised better hydration.   Plan: Comprehensive metabolic panel

## 2012-12-19 ENCOUNTER — Other Ambulatory Visit: Payer: Self-pay

## 2013-02-27 ENCOUNTER — Other Ambulatory Visit: Payer: Self-pay | Admitting: Family Medicine

## 2013-04-15 ENCOUNTER — Ambulatory Visit: Payer: BC Managed Care – PPO | Admitting: Family Medicine

## 2013-04-15 ENCOUNTER — Encounter: Payer: Self-pay | Admitting: Family Medicine

## 2013-04-15 VITALS — BP 126/84 | HR 117 | Temp 98.0°F | Resp 16 | Ht 75.75 in | Wt 196.2 lb

## 2013-04-15 DIAGNOSIS — I10 Essential (primary) hypertension: Secondary | ICD-10-CM

## 2013-04-15 MED ORDER — CARISOPRODOL 350 MG PO TABS
ORAL_TABLET | ORAL | Status: DC
Start: 2013-04-15 — End: 2013-07-29

## 2013-04-15 MED ORDER — CARISOPRODOL 350 MG PO TABS: ORAL_TABLET | ORAL | Status: DC

## 2013-04-15 NOTE — Progress Notes (Signed)
S:  This 43 y.o. Cauc male is here for well controlled HTN follow-up; he is feeling well and his family has a new puppy. He is compliant w/ medications and denies any medication adverse reactions. [facility] chronic [facility] pain and sees a chiropractor for regular adjustments.  Patient Active Problem List   Diagnosis Date Noted  . Scoliosis of thoracic spine 06/20/2011  . Mucocutaneous candidiasis 10/31/2010  . HTN (hypertension) 10/31/2010  . Night sweats 10/31/2010   Prior to Admission medications   Medication Sig Start Date End Date Taking? Authorizing Provider  carisoprodol (SOMA) 350 MG tablet TAKE ONE TABLET BY MOUTH THREE TIMES DAILY AS NEEDED 04/15/13  Yes Maurice MarchBarbara B Morgin Halls, MD  lisinopril-hydrochlorothiazide (PRINZIDE,ZESTORETIC) 20-25 MG per tablet Take 1 tablet by mouth daily. 10/15/12  Yes Maurice MarchBarbara B Rakan Soffer, MD   PMHx, Surg Hx, Soc and Fam Hx reviewed.  ROS: Negative for diaphoresis, fatigue, vision disturbances, CP or tightness, palpitations, SOB or DOE, cough, edema. GI problems, Abd pain, skin changes, myalgias, HA, dizziness, numbness, weakness or syncope.  O: Filed Vitals:   04/15/13 1310  BP: 126/84  Pulse: 117  Temp: 98 F (36.7 C)  Resp: 16   GEN: In NAD: WN,WD. HENT: [place]/AT; EOMI w/ clear conj/sclerae. Otherwise unremarkable. COR: RRR. LUNGS: Unlabored resp. SKIN: W&D; intact w/o diaphoresis or erythema. MS: MAEs; no deformities. Gait normal. NEURO: A&O x 3; CNs intact. Nonfocal.  A/P: HTN (hypertension) - Stable and controlled. Continue current medications. Plan: Comprehensive metabolic panel, LDL Cholesterol, Direct

## 2013-04-16 LAB — COMPREHENSIVE METABOLIC PANEL
ALBUMIN: 4.8 g/dL (ref 3.5–5.2)
ALT: 44 U/L (ref 0–53)
AST: 24 U/L (ref 0–37)
Alkaline Phosphatase: 62 U/L (ref 39–117)
BUN: 13 mg/dL (ref 6–23)
CALCIUM: 10.2 mg/dL (ref 8.4–10.5)
CHLORIDE: 98 meq/L (ref 96–112)
CO2: 31 meq/L (ref 19–32)
Creat: 0.77 mg/dL (ref 0.50–1.35)
GLUCOSE: 73 mg/dL (ref 70–99)
POTASSIUM: 4.3 meq/L (ref 3.5–5.3)
SODIUM: 136 meq/L (ref 135–145)
TOTAL PROTEIN: 7.3 g/dL (ref 6.0–8.3)
Total Bilirubin: 0.5 mg/dL (ref 0.2–1.2)

## 2013-04-16 LAB — LDL CHOLESTEROL, DIRECT: LDL DIRECT: 132 mg/dL — AB

## 2013-04-17 ENCOUNTER — Encounter: Payer: Self-pay | Admitting: Family Medicine

## 2013-07-29 ENCOUNTER — Other Ambulatory Visit: Payer: Self-pay | Admitting: Family Medicine

## 2013-07-29 NOTE — Telephone Encounter (Signed)
Carisoprodol refill phoned to pt's pharmacy.

## 2013-07-30 ENCOUNTER — Other Ambulatory Visit: Payer: Self-pay | Admitting: Family Medicine

## 2013-10-23 ENCOUNTER — Encounter: Payer: Self-pay | Admitting: Family Medicine

## 2013-10-23 ENCOUNTER — Ambulatory Visit (INDEPENDENT_AMBULATORY_CARE_PROVIDER_SITE_OTHER): Payer: BC Managed Care – PPO | Admitting: Family Medicine

## 2013-10-23 VITALS — BP 116/82 | HR 126 | Temp 98.1°F | Resp 16 | Ht 72.0 in | Wt 192.2 lb

## 2013-10-23 DIAGNOSIS — R000 Tachycardia, unspecified: Secondary | ICD-10-CM

## 2013-10-23 DIAGNOSIS — Z0000 Encounter for general adult medical examination without abnormal findings: Secondary | ICD-10-CM

## 2013-10-23 DIAGNOSIS — I10 Essential (primary) hypertension: Secondary | ICD-10-CM

## 2013-10-23 LAB — BASIC METABOLIC PANEL
BUN: 17 mg/dL (ref 6–23)
CALCIUM: 9.7 mg/dL (ref 8.4–10.5)
CO2: 26 mEq/L (ref 19–32)
Chloride: 99 mEq/L (ref 96–112)
Creat: 0.87 mg/dL (ref 0.50–1.35)
GLUCOSE: 80 mg/dL (ref 70–99)
Potassium: 4.2 mEq/L (ref 3.5–5.3)
Sodium: 135 mEq/L (ref 135–145)

## 2013-10-23 LAB — LIPID PANEL
CHOL/HDL RATIO: 3.1 ratio
Cholesterol: 241 mg/dL — ABNORMAL HIGH (ref 0–200)
HDL: 77 mg/dL (ref >39)
LDL Cholesterol: 149 mg/dL — ABNORMAL HIGH (ref 0–99)
TRIGLYCERIDES: 75 mg/dL (ref <150)
VLDL: 15 mg/dL (ref 0–40)

## 2013-10-23 LAB — CBC WITH DIFFERENTIAL/PLATELET
BASOS ABS: 0 10*3/uL (ref 0.0–0.1)
Basophils Relative: 0 % (ref 0–1)
Eosinophils Absolute: 0.1 10*3/uL (ref 0.0–0.7)
Eosinophils Relative: 2 % (ref 0–5)
HCT: 47.3 % (ref 39.0–52.0)
Hemoglobin: 16.5 g/dL (ref 13.0–17.0)
LYMPHS ABS: 1.5 10*3/uL (ref 0.7–4.0)
Lymphocytes Relative: 24 % (ref 12–46)
MCH: 29.4 pg (ref 26.0–34.0)
MCHC: 34.9 g/dL (ref 30.0–36.0)
MCV: 84.2 fL (ref 78.0–100.0)
Monocytes Absolute: 0.6 10*3/uL (ref 0.1–1.0)
Monocytes Relative: 10 % (ref 3–12)
NEUTROS ABS: 4 10*3/uL (ref 1.7–7.7)
Neutrophils Relative %: 64 % (ref 43–77)
Platelets: 246 10*3/uL (ref 150–400)
RBC: 5.62 MIL/uL (ref 4.22–5.81)
RDW: 13.5 % (ref 11.5–15.5)
WBC: 6.2 10*3/uL (ref 4.0–10.5)

## 2013-10-23 LAB — THYROID PANEL WITH TSH
Free Thyroxine Index: 1.5 (ref 1.4–3.8)
T3 UPTAKE: 28 % (ref 22.0–35.0)
T4 TOTAL: 5.4 ug/dL (ref 4.5–12.0)
TSH: 2.695 u[IU]/mL (ref 0.350–4.500)

## 2013-10-23 MED ORDER — LISINOPRIL-HYDROCHLOROTHIAZIDE 20-25 MG PO TABS: 1.0000 | ORAL_TABLET | Freq: Every day | ORAL | Status: DC

## 2013-10-23 MED ORDER — CARISOPRODOL 350 MG PO TABS: ORAL_TABLET | ORAL | Status: DC

## 2013-10-23 NOTE — Progress Notes (Signed)
Subjective:    Patient ID: Jesse Stevenson, male    DOB: March 27, 1970, 43 y.o.   MRN: [id number]  HPI  This 43 y.o. Cauc male is here for CPE. He has well controlled HTN and chronic back pain due to scoliosis. A local chiropractor (Dr. Atilano Median) dose regular adjustments. Pt takes Soma 44-3 times a day for muscle spasms.   HCM: Vision- Periodically (wears corrective lenses)           IMM- Current.   Patient Active Problem List   Diagnosis Date Noted  . Scoliosis of thoracic spine 06/20/2011  . Mucocutaneous candidiasis 10/31/2010  . HTN (hypertension) 10/31/2010  . Night sweats 10/31/2010    Prior to Admission medications   Medication Sig Start Date End Date Taking? Authorizing Provider  carisoprodol (SOMA) 350 MG tablet Take 1 tablet by mouth 3 times a day as needed for muscle spasms.   Yes Maurice March, MD  lisinopril-hydrochlorothiazide (PRINZIDE,ZESTORETIC) 20-25 MG per tablet Take 1 tablet by mouth daily.   Yes Maurice March, MD    History   Social History  . Marital Status: Married    Spouse Name: N/A    Number of Children: N/A  . Years of Education: N/A   Occupational History  . Scientist    Social History Main Topics  . Smoking status: Never Smoker   . Smokeless tobacco: Not on file  . Alcohol Use: 1.8 - 3.0 oz/week    3-5 Glasses of wine per week  . Drug Use: No  . Sexual Activity: Not on file   Other Topics Concern  . Not on file   Social History Narrative   Married. Education: [employer].    Family History  Problem Relation Age of Onset  . Hypertension Mother   . Heart disease Maternal Grandfather     Review of Systems  Constitutional: Negative.   HENT: Negative.   Eyes: Negative.   Respiratory: Negative.   Cardiovascular: Negative.        Is aware of a rapid heart rate w/ anxiety or stress.  Gastrointestinal: Negative.        Stools are loose.  Endocrine: Negative.   Genitourinary: Negative.   Musculoskeletal: Negative.   Skin:  Negative.   Allergic/Immunologic: Negative.   Neurological: Negative.   Hematological: Negative.   Psychiatric/Behavioral: Negative.       Objective:   Physical Exam  Nursing note and vitals reviewed. Constitutional: He is oriented to person, place, and time. He appears well-developed and well-nourished.  HENT:  Head: Normocephalic and atraumatic.  Right Ear: Hearing, tympanic membrane, external ear and ear canal normal.  Left Ear: Hearing, tympanic membrane, external ear and ear canal normal.  Nose: Nose normal. No nasal deformity or septal deviation.  Mouth/Throat: Uvula is midline, oropharynx is clear and moist and mucous membranes are normal. No oral lesions. Normal dentition. No dental caries.  Eyes: Conjunctivae, EOM and lids are normal. Pupils are equal, round, and reactive to light. No scleral icterus.  Neck: Trachea normal, normal range of motion, full passive range of motion without pain and phonation normal. Neck supple. No spinous process tenderness and no muscular tenderness present. No mass and no thyromegaly present.  Cardiovascular: Regular rhythm, S1 normal, S2 normal, normal heart sounds and normal pulses.   No extrasystoles are present. Tachycardia present.  PMI is not displaced.  Exam reveals no gallop and no friction rub.   No murmur heard. Pulmonary/Chest: Effort normal and breath sounds normal. No  respiratory distress. He has no decreased breath sounds.  Abdominal: Soft. Normal appearance, normal aorta and bowel sounds are normal. He exhibits no distension, no pulsatile midline mass and no mass. There is no hepatosplenomegaly. There is no tenderness. There is no guarding and no CVA tenderness.  Genitourinary:  Deferred.  Musculoskeletal:       Cervical back: Normal.       Thoracic back: He exhibits deformity.       Lumbar back: Normal.  Thoracic spine w/ mild curvature to R; + muscle spasms.  Lymphadenopathy:       Head (right side): No submental, no  submandibular, no tonsillar, no preauricular, no posterior auricular and no occipital adenopathy present.       Head (left side): No submental, no submandibular, no tonsillar, no preauricular, no posterior auricular and no occipital adenopathy present.    He has no cervical adenopathy.       Right: No inguinal and no supraclavicular adenopathy present.       Left: No inguinal and no supraclavicular adenopathy present.  Neurological: He is alert and oriented to person, place, and time. He has normal strength. He displays no atrophy and no tremor. No cranial nerve deficit or sensory deficit. He exhibits normal muscle tone. Coordination and gait normal. He displays no Babinski's sign on the right side. He displays no Babinski's sign on the left side.  Reflex Scores:      Tricep reflexes are 2+ on the right side and 2+ on the left side.      Bicep reflexes are 3+ on the right side and 3+ on the left side.      Brachioradialis reflexes are 3+ on the right side and 3+ on the left side.      Patellar reflexes are 3+ on the right side and 3+ on the left side. Skin: Skin is warm, dry and intact. No ecchymosis, no lesion, no petechiae and no rash noted. He is not diaphoretic. No cyanosis. No pallor. Nails show no clubbing.  Complexion is ruddy.  Psychiatric: He has a normal mood and affect. His speech is normal and behavior is normal. Judgment and thought content normal. Cognition and memory are normal.    ECG: Sinus tachycardia; no ST-TW changes. No ectopy.     Assessment & Plan:  Routine general medical examination at a health care facility - Plan: Thyroid Panel With TSH, Basic metabolic panel, CBC with Differential, Lipid panel  Tachycardia - Suspect this may be side effect of Soma; advised pt to limit use to 2x daily for 2 weeks then reduce to once daily if possible. Follow-up w/ chiropractor. May need to change to different muscle relaxant. Maintain adequate hydration; pt admits that he does not  consume enough fluids, especially water. If heart rate does not come under 100 with these measures, will refer pt to Cardiology. Plan: EKG 12-Lead, Thyroid Panel With TSH, Basic metabolic panel, CBC with Differential, Lipid panel  Essential hypertension- Stable; continue current medication (Lisinopril- HCTZ 20-25 mg)   Meds ordered this encounter  Medications  . lisinopril-hydrochlorothiazide (PRINZIDE,ZESTORETIC) 20-25 MG per tablet    Sig: Take 1 tablet by mouth daily.    Dispense:  90 tablet    Refill:  3  . carisoprodol (SOMA) 350 MG tablet    Sig: Take 1 tablet by mouth 1-2 times a day as needed for muscle spasms.    Dispense:  60 tablet    Refill:  1

## 2013-10-23 NOTE — Patient Instructions (Addendum)
Nonspecific Tachycardia Tachycardia is a faster than normal heartbeat (more than 100 beats per minute). In adults, the heart normally beats between 60 and 100 times a minute. A fast heartbeat may be a normal response to exercise or stress. It does not necessarily mean that something is wrong. However, sometimes when your heart beats too fast it may not be able to pump enough blood to the rest of your body. This can result in chest pain, shortness of breath, dizziness, and even fainting. Nonspecific tachycardia means that the specific cause or pattern of your tachycardia is unknown. CAUSES  Tachycardia may be harmless or it may be due to a more serious underlying cause. Possible causes of tachycardia include:  Exercise or exertion.  Fever.  Pain or injury.  Infection.  Loss of body fluids (dehydration).  Overactive thyroid.  Lack of red blood cells (anemia).  Anxiety and stress.  Alcohol.  Caffeine.  Tobacco products.  Diet pills.  Illegal drugs.  Heart disease. SYMPTOMS  Rapid or irregular heartbeat (palpitations).  Suddenly feeling your heart beating (cardiac awareness).  Dizziness.  Tiredness (fatigue).  Shortness of breath.  Chest pain.  Nausea.  Fainting. DIAGNOSIS  Your caregiver will perform a physical exam and take your medical history. In some cases, a heart specialist (cardiologist) may be consulted. Your caregiver may also order:  Blood tests.  Electrocardiography. This test records the electrical activity of your heart.  A heart monitoring test. TREATMENT  Treatment will depend on the likely cause of your tachycardia. The goal is to treat the underlying cause of your tachycardia. Treatment methods may include:  Replacement of fluids or blood through an intravenous (IV) tube for moderate to severe dehydration or anemia.  New medicines or changes in your current medicines.  Diet and lifestyle changes.  Treatment for certain  infections.  Stress relief or relaxation methods. HOME CARE INSTRUCTIONS   Rest.  Drink enough fluids to keep your urine clear or pale yellow.  Do not smoke.  Avoid:  Caffeine.  Tobacco.  Alcohol.  Chocolate.  Stimulants such as over-the-counter diet pills or pills that help you stay awake.  Situations that cause anxiety or stress.  Illegal drugs such as marijuana, phencyclidine (PCP), and cocaine.  Only take medicine as directed by your caregiver.  Keep all follow-up appointments as directed by your caregiver. SEEK IMMEDIATE MEDICAL CARE IF:   You have pain in your chest, upper arms, jaw, or neck.  You become weak, dizzy, or feel faint.  You have palpitations that will not go away.  You vomit, have diarrhea, or pass blood in your stool.  Your skin is cool, pale, and wet.  You have a fever that will not go away with rest, fluids, and medicine. MAKE SURE YOU:   Understand these instructions.  Will watch your condition.  Will get help right away if you are not doing well or get worse. Document Released: 03/09/2004 Document Revised: 04/24/2011 Document Reviewed: 01/10/2011 [facility] Patient Information 2015 [place], [place]. This information is not intended to replace advice given to you by your health care provider. Make sure you discuss any questions you have with your health care provider.   One of your medications (SOMA) can cause tachycardia.  Try not to take this medication more than once a day for muscle spasms.  If needed, we can discuss using a different muscle relaxant.  Also, make sure you are staying well hydrated. Drinks plenty of water ( 40-50 ounces daily) as well as some juices  and other non-caffeine liquids.

## 2013-11-20 ENCOUNTER — Ambulatory Visit (INDEPENDENT_AMBULATORY_CARE_PROVIDER_SITE_OTHER): Payer: BC Managed Care – PPO | Admitting: Family Medicine

## 2013-11-20 ENCOUNTER — Encounter: Payer: Self-pay | Admitting: Family Medicine

## 2013-11-20 VITALS — BP 130/82 | HR 105 | Temp 97.4°F | Resp 16 | Ht 77.5 in | Wt 199.0 lb

## 2013-11-20 DIAGNOSIS — R000 Tachycardia, unspecified: Secondary | ICD-10-CM

## 2013-11-20 MED ORDER — CARISOPRODOL 350 MG PO TABS: ORAL_TABLET | ORAL | Status: DC

## 2013-11-20 NOTE — Progress Notes (Signed)
S:  This 43 y.o. Cauc male is here to follow-up on tachycardia. He tracks his HR w/ a smart watch. Increased hydration has resulted in average HR 70- 90. He has reduced use of Soma to twice daily. He denies diaphoresis, fatigue,CP or palpitations, SOB or DOE, cough, muscle cramps, HA, dizziness, lightheadedness, weakness or syncope.  Patient Active Problem List   Diagnosis Date Noted  . Tachycardia 10/23/2013  . Scoliosis of thoracic spine 06/20/2011  . Mucocutaneous candidiasis 10/31/2010  . HTN (hypertension) 10/31/2010  . Night sweats 10/31/2010    Prior to Admission medications   Medication Sig Start Date End Date Taking? Authorizing Provider  carisoprodol (SOMA) 350 MG tablet Take 1 tablet by mouth 1-2 times a day as needed for muscle spasms.   Yes Maurice MarchBarbara B Niall Illes, MD  lisinopril-hydrochlorothiazide (PRINZIDE,ZESTORETIC) 20-25 MG per tablet Take 1 tablet by mouth daily. 10/23/13  Yes Maurice MarchBarbara B Nini Cavan, MD   ROS; Asper HPI.  O: Filed Vitals:   11/20/13 1322  BP: 130/82  Pulse: 105  Temp: 97.4 F (36.3 C)  Resp: 16    GEN: In NAD; WN,WD. HENT: [place]/AT; Otherwise unremarkable. COR: RRR. LUNGS: Unlabored resp. SKIN: W&D. NEURO: A&O x 3; CNs intact. Nonfocal.  A/P: Tachycardia- Resolved w/ adequate hydration. Continue to monitor.  RTC prn.

## 2014-01-24 ENCOUNTER — Telehealth: Payer: BC Managed Care – PPO | Admitting: Physician Assistant

## 2014-01-24 DIAGNOSIS — M549 Dorsalgia, unspecified: Secondary | ICD-10-CM

## 2014-01-24 MED ORDER — ETODOLAC 300 MG PO CAPS
300.0000 mg | ORAL_CAPSULE | Freq: Two times a day (BID) | ORAL | Status: DC
Start: 2014-01-24 — End: 2014-07-02

## 2014-01-24 NOTE — Progress Notes (Signed)
We are sorry that you are not feeling well.  Here is how we plan to help!  Based on what you have shared with me it looks like you mostly have acute back pain.  Acute back pain is defined as musculoskeletal pain that can resolve in 1-3 weeks with conservative treatment.  I have prescribed Etodolac 300 mg twice a day.  Continue your Soma as directed.  Apply topical Aspercreme to the back. Some patients experience stomach irritation or in increased heartburn with anti-inflammatory drugs.  Please keep in mind that muscle relaxer's can cause fatigue and should not be taken while at work or driving.  Back pain is very common.  The pain often gets better over time.  The cause of back pain is usually not dangerous.  Most people can learn to manage their back pain on their own.  Home Care  Stay active.  Start with short walks on flat ground if you can.  Try to walk farther each day.  Do not sit, drive or stand in one place for more than 30 minutes.  Do not stay in bed.  Do not avoid exercise or work.  Activity can help your back heal faster.  Be careful when you bend or lift an object.  Bend at your knees, keep the object close to you, and do not twist.  Sleep on a firm mattress.  Lie on your side, and bend your knees.  If you lie on your back, put a pillow under your knees.  Only take medicines as told by your doctor.  Put ice on the injured area.  Put ice in a plastic bag  Place a towel between your skin and the bag  Leave the ice on for 15-20 minutes, 3-4 times a day for the first 2-3 days.  After that, you can switch between ice and heat packs.  Ask your doctor about back exercises or massage.  Avoid feeling anxious or stressed.  Find good ways to deal with stress, such as exercise.  Get Help Right Way If:  Your pain does not go away with rest or medicine.  Your pain does not go away in 1 week.  You have new problems.  You do not feel well.  The pain spreads into your  legs.  You cannot control when you poop (bowel movement) or pee (urinate)  You feel sick to your stomach (nauseous) or throw up (vomit)  You have belly (abdominal) pain.  You feel like you may pass out (faint).  If you develop a fever.  Make Sure you:  Understand these instructions.  Will watch your condition  Will get help right away if you are not doing well or get worse.  Your e-visit answers were reviewed by a board certified advanced clinical practitioner to complete your personal care plan.  Depending on the condition, your plan could have included both over the counter or prescription medications.  Please review your pharmacy choice.  If there is a problem, you may call our nursing hot line at [phone] and have the prescription routed to another pharmacy.  Your safety is important to us.  If you have drug allergies check your prescription carefully.    You can use MyChart to ask questions about today's visit, request a non-urgent call back, or ask for a work or school excuse.  You will get an e-mail in the next two days asking about your experience.  I hope that your e-visit has been valuable and will speed  your recovery. Thank you for using e-visits.

## 2014-05-04 ENCOUNTER — Other Ambulatory Visit: Payer: Self-pay | Admitting: Family Medicine

## 2014-05-05 MED ORDER — CARISOPRODOL 350 MG PO TABS: ORAL_TABLET | ORAL | Status: AC

## 2014-05-05 NOTE — Telephone Encounter (Signed)
mcpherson [employer]

## 2014-05-05 NOTE — Telephone Encounter (Signed)
Carisoprodol refill phoned to pt's pharmacy; pt requests refill x 6 months. Pt advised about scheduling annual physical due on or after Sept 10, 2016 (if he decides to have CPE this year). I have suggested he schedule according to days that are convenient for him or per provider preference, if he has one. He states that Dr. Patsy Stevenson was his physician for many years and he wants to resume care with her. I suggested he call in August to get an appt in Sept. W/ Dr. Patsy Stevenson.

## 2014-05-05 NOTE — Telephone Encounter (Signed)
Patient called to follow up on refill request and would like to receive a phone when the medication is sent. [phone]

## 2014-06-04 ENCOUNTER — Telehealth: Payer: Self-pay | Admitting: Physician Assistant

## 2014-06-04 DIAGNOSIS — J329 Chronic sinusitis, unspecified: Secondary | ICD-10-CM

## 2014-06-04 DIAGNOSIS — B9789 Other viral agents as the cause of diseases classified elsewhere: Secondary | ICD-10-CM

## 2014-06-04 MED ORDER — FLUTICASONE PROPIONATE 50 MCG/ACT NA SUSP: 2.0000 | Freq: Every day | NASAL | Status: DC

## 2014-06-04 NOTE — Progress Notes (Signed)
We are sorry that you are not feeling well.  Here is how we plan to help!  Based on what you have shared with me it looks like you have sinusitis.  Sinusitis is inflammation and infection in the sinus cavities of the head.  Based on your presentation I believe you most likely have Acute Viral Sinusitis. This is an infection most likely caused by a virus.  There is not specific treatment for viral sinusitis other than to help you with the symptoms until the infection runs it's course.  You may use an oral decongestant such as Mucinex D or if you have glaucoma or high blood pressure use plain Mucinex.  Saline nasal spray help and can safely be used as often as needed for congestion, I have prescribed fluticasone nasal spray. Spray two sprays in each nostril once a day to help reduce your symptoms.  Some authorities believe that zinc sprays or the use of Echinacea may shorten the course of your symptoms.  Sinus infections are not as easily transmitted as other respiratory infection, however we still recommend that you avoid close contact with loved ones, especially the very young and elderly.  Remember to wash your hands thoroughly throughout the day as this is the number one way to prevent the spread of infection!  Home Care:  Only take medications as instructed by your medical team.  Complete the entire course of an antibiotic.  Do not take these medications with alcohol.  A steam or ultrasonic humidifier can help congestion.  You can place a towel over your head and breathe in the steam from hot water coming from a faucet.  Avoid close contacts especially the very young and the elderly.  Cover your mouth when you cough or sneeze.  Always remember to wash your hands.  Get Help Right Away If:  You develop worsening fever or sinus pain.  You develop a severe head ache or visual changes.  Your symptoms persist after you have completed your treatment plan.  Make sure you  Understand these  instructions.  Will watch your condition.  Will get help right away if you are not doing well or get worse.  Your e-visit answers were reviewed by a board certified advanced clinical practitioner to complete your personal care plan.  Depending on the condition, your plan could have included both over the counter or prescription medications.  If there is a problem please reply  once you have received a response from your provider.  Your safety is important to us.  If you have drug allergies check your prescription carefully.    You can use MyChart to ask questions about today's visit, request a non-urgent call back, or ask for a work or school excuse.  You will get an e-mail in the next two days asking about your experience.  I hope that your e-visit has been valuable and will speed your recovery. Thank you for using e-visits.

## 2014-06-25 ENCOUNTER — Telehealth: Payer: Self-pay

## 2014-06-25 NOTE — Telephone Encounter (Signed)
Please advise on refill.

## 2014-06-25 NOTE — Telephone Encounter (Signed)
Patient called in stating that he needs a refill on his Epi-Pen. [facility] [address]   His call back number is [phone]

## 2014-06-26 MED ORDER — EPINEPHRINE 0.3 MG/0.3ML IJ SOAJ: 0.3000 mg | Freq: Once | INTRAMUSCULAR | Status: DC

## 2014-07-02 ENCOUNTER — Ambulatory Visit (INDEPENDENT_AMBULATORY_CARE_PROVIDER_SITE_OTHER): Payer: BLUE CROSS/BLUE SHIELD | Admitting: Physician Assistant

## 2014-07-02 ENCOUNTER — Encounter: Payer: Self-pay | Admitting: Family Medicine

## 2014-07-02 VITALS — BP 120/100 | HR 87 | Temp 97.5°F | Resp 16 | Ht 76.0 in | Wt 197.0 lb

## 2014-07-02 DIAGNOSIS — R42 Dizziness and giddiness: Secondary | ICD-10-CM | POA: Diagnosis not present

## 2014-07-02 DIAGNOSIS — M545 Low back pain, unspecified: Secondary | ICD-10-CM

## 2014-07-02 DIAGNOSIS — Z8739 Personal history of other diseases of the musculoskeletal system and connective tissue: Secondary | ICD-10-CM | POA: Diagnosis not present

## 2014-07-02 MED ORDER — HYDROCODONE-ACETAMINOPHEN 5-325 MG PO TABS: 0.5000 | ORAL_TABLET | Freq: Four times a day (QID) | ORAL | Status: DC | PRN

## 2014-07-02 NOTE — Progress Notes (Signed)
07/02/2014 at 3:51 PM  Jesse Stevenson / DOB: July 25, 1970 / MRN: [id number]  The patient has Mucocutaneous candidiasis; HTN (hypertension); and Scoliosis of thoracic spine on his problem list.  SUBJECTIVE  Chief complaint: Back Pain  Patient has had scoliosis since age of 89 and has chronic back pain as a result.  Takes Soma daily or relief and with good results.  Has periodic flares up that occur one or twice q6 week.  Sees a chiropractor and this often helps for flares. Denies bowel and bladder incontinence during flares.    Patient reports that he gets dizzy if his heart rate gets past 110, or if he is dehydrated.  He noted one instance of having to sit down when his heart rate reached 130.  Reports a grandfather who died of a heart attack in his 8680, and no immediate family members with CAD. He has a history of MVP, but he reports that he saw a cardiologist and reports this was a misdiagnosis. He denies chest pain, nausea, radicular pain, and reports some presyncope with this.      He  has a past medical history of HTN (hypertension) and Mitral valve prolapse.    Medications reviewed and updated by myself where necessary, and exist elsewhere in the encounter.   Jesse Stevenson is allergic to etodolac and aspirin. He  reports that he has never smoked. He does not have any smokeless tobacco history on file. He reports that he drinks about 1.8 - 3.0 oz of alcohol per week. He reports that he does not use illicit drugs. He  has no sexual activity history on file. The patient  has past surgical history that includes Hernia repair and Tonsillectomy and adenoidectomy (02/13/1978).  His family history includes Heart disease in his maternal grandfather; Hypertension in his mother.  Review of Systems  Constitutional: Negative for fever and chills.  Respiratory: Negative for cough.   Genitourinary: Negative for dysuria.  Musculoskeletal: Positive for myalgias and back pain. Negative for joint pain.    Skin: Negative for itching and rash.  Neurological: Negative for dizziness and headaches.    OBJECTIVE  His  height is 6\' 4"  (1.93 m) and weight is 197 lb (89.359 kg). His oral temperature is 97.5 F (36.4 C). His blood pressure is 120/100 and his pulse is 87. His respiration is 16 and oxygen saturation is 98%.  The patient's body mass index is 23.99 kg/(m^2).  Physical Exam  Constitutional: He is oriented to person, place, and time. He appears well-developed and well-nourished. No distress.  HENT:  Head: Normocephalic.  Right Ear: External ear normal.  Left Ear: External ear normal.  Cardiovascular: Normal rate and regular rhythm.   Respiratory: Effort normal and breath sounds normal.  GI: Soft. Bowel sounds are normal.  Musculoskeletal: Normal range of motion.  Neurological: He is alert and oriented to person, place, and time.  Skin: Skin is warm and dry. He is not diaphoretic.  Psychiatric: He has a normal mood and affect. His behavior is normal. Judgment and thought content normal.    No results found for this or any previous visit (from the past 24 hour(s)).  ASSESSMENT & PLAN  Jesse Stevenson was seen today for back pain.  Diagnoses and all orders for this visit:  Dizzinesses: Patient with concerning symptoms.  I am concerned this patient may have an arrhyhtmia with exertion and he would benefit from a cardiac evaluation.  [facility] and appreciate the work of  our specialist.   Midline low back pain without sciatica: Patient would benefit from seeing a pain specialist.  He gets some control of his symptoms with chronic Soma therapy, however, now requesting pain medication for a chronic pain condition.  Will refer. -     Will provide a low dose narcotic and have instructed him to take 1/2 tab bid during the waking hours only given the risk of respiratory depression.    History of scoliosis: Chest radiograph from 2013 showing mild dextro scoliosis without  vertebral anomaly.  Patient is likely deconditioned.  I will refer to physical therapy while waiting for pain referral to go through.      The patient was advised to call or come back to clinic if he does not see an improvement in symptoms, or worsens with the above plan.   Deliah BostonMichael Clark, MHS, PA-C [facility] 07/02/2014 3:51 PM

## 2014-07-03 ENCOUNTER — Telehealth: Payer: Self-pay

## 2014-07-03 NOTE — Telephone Encounter (Signed)
Pt would like a refill on his carisoprodol (SOMA) 350 MG tablet [[id number]][[id number]], he said his electronic request was being sent over a day earlier. He is heading out of town this Sunday-5/22. Please advise at [phone]

## 2014-07-03 NOTE — Telephone Encounter (Signed)
I phoned pt's pharmacy and authorized early refill for Soma.

## 2014-07-03 NOTE — Telephone Encounter (Signed)
I don't know if anyone else can address this in Dr McPherson's absence this weekend (and pt report he is leaving town Sunday)? If not, please forward to Dr Audria NineMcPherson.

## 2014-08-04 ENCOUNTER — Telehealth: Payer: Self-pay

## 2014-08-04 NOTE — Telephone Encounter (Signed)
Patient needs a refill on his hydrocodone.  He has a pain clinic appointment on July 11.  He needs a refill on this to last him until the appointment.  Needs tomorrow because he is going out of town for his child's baseball tournament.  [phone]

## 2014-08-05 MED ORDER — HYDROCODONE-ACETAMINOPHEN 5-325 MG PO TABS: 0.5000 | ORAL_TABLET | Freq: Three times a day (TID) | ORAL | Status: DC | PRN

## 2014-08-05 NOTE — Telephone Encounter (Signed)
Called pt, advised Rx ready for pick up.

## 2014-08-13 ENCOUNTER — Encounter: Payer: Self-pay | Admitting: Internal Medicine

## 2014-08-13 ENCOUNTER — Telehealth: Payer: Self-pay | Admitting: Internal Medicine

## 2014-08-14 NOTE — Telephone Encounter (Signed)
Close encounter 

## 2014-09-01 ENCOUNTER — Ambulatory Visit: Payer: Self-pay | Admitting: Internal Medicine

## 2014-10-08 ENCOUNTER — Ambulatory Visit: Payer: Self-pay | Admitting: Internal Medicine

## 2014-11-05 ENCOUNTER — Encounter: Payer: Self-pay | Admitting: Internal Medicine

## 2014-11-05 ENCOUNTER — Ambulatory Visit (INDEPENDENT_AMBULATORY_CARE_PROVIDER_SITE_OTHER): Payer: BLUE CROSS/BLUE SHIELD | Admitting: Internal Medicine

## 2014-11-05 VITALS — BP 122/80 | HR 117 | Ht 73.0 in | Wt 196.0 lb

## 2014-11-05 DIAGNOSIS — R000 Tachycardia, unspecified: Secondary | ICD-10-CM

## 2014-11-05 DIAGNOSIS — I471 Supraventricular tachycardia: Secondary | ICD-10-CM

## 2014-11-05 DIAGNOSIS — I4711 Inappropriate sinus tachycardia, so stated: Secondary | ICD-10-CM | POA: Insufficient documentation

## 2014-11-05 DIAGNOSIS — I10 Essential (primary) hypertension: Secondary | ICD-10-CM | POA: Diagnosis not present

## 2014-11-05 MED ORDER — PROPRANOLOL HCL ER 60 MG PO CP24: 60.0000 mg | ORAL_CAPSULE | Freq: Every day | ORAL | Status: DC

## 2014-11-05 NOTE — Progress Notes (Signed)
OFFICE NOTE  Chief Complaint:  No complaints  Primary Care Physician: No primary care Karalynn Cottone on file.  HPI:  Jesse Stevenson is a pleasant 44 year old male is currently referred to me for evaluation of tachycardia. He also has a history of hypertension and chronic low back pain on a Vicodin. After brief discussion he tells me that he has tachycardia pretty much for his whole life. He remembers as a teenager having heart rates in the 90s and that his heart rate is often times in the low 110-120 range. He also has a daughter who is tachycardic at rest. He describes some significant anxiety at times and previously saw psychiatrist. One point he was playing p.m. on having difficulty and took propranolol with some improvement in heart rate, but then was taken off the medicine or stopped taking it for unknown reasons. Recently seen by primary care Ralph Brouwer noted the tachycardia and felt that additional workup was necessary. [name] has no chest pain, no shortness of breath or other associated symptoms. Over the years she's had 2 echocardiograms, the first showed possible mitral valve prolapse however that was many years ago and subsequently an echo demonstrated that there was no significant prolapse by current guidelines. He's also had stress testing in the past which was negative. He's never had any presyncope or syncopal episodes. The only time he is aware of his heart racing is that occasionally gets up much over 130, particularly when he is significantly exerting himself. Despite the long-standing tachycardia, he has not developed cardiomyopathy based on his 2 echocardiograms.  PMHx:  Past Medical History  Diagnosis Date  . HTN (hypertension)   . Mitral valve prolapse     Past Surgical History  Procedure Laterality Date  . Hernia repair    . Tonsillectomy and adenoidectomy  02/13/1978    FAMHx:  Family History  Problem Relation Age of Onset  . Hypertension Mother   . Heart  disease Maternal Grandfather     SOCHx:   reports that he has never smoked. He does not have any smokeless tobacco history on file. He reports that he drinks about 1.8 - 3.0 oz of alcohol per week. He reports that he does not use illicit drugs.  ALLERGIES:  Allergies  Allergen Reactions  . Etodolac Nausea And Vomiting    Pt states, "that medication made me sick".  . Aspirin Rash    ROS: A comprehensive review of systems was negative.  HOME MEDS: Current Outpatient Prescriptions  Medication Sig Dispense Refill  . carisoprodol (SOMA) 350 MG tablet TAKE ONE TABLET BY MOUTH ONCE TO TWICE DAILY AS NEEDED FOR MUSCLE SPASM 60 tablet 5  . EPINEPHrine 0.3 mg/0.3 mL IJ SOAJ injection Inject 0.3 mLs (0.3 mg total) into the muscle once. 2 Device 1  . HYDROcodone-acetaminophen (NORCO) 5-325 MG per tablet Take 0.5 tablets by mouth 3 (three) times daily as needed for severe pain. Take during the waking hours only. Take no more than 1/2 tab for severe pain twice daily. 30 tablet 0  . lisinopril-hydrochlorothiazide (PRINZIDE,ZESTORETIC) 20-25 MG per tablet Take 1 tablet by mouth daily. 90 tablet 3  . propranolol ER (INDERAL LA) 60 MG 24 hr capsule Take 1 capsule (60 mg total) by mouth daily. 30 capsule 6   No current facility-administered medications for this visit.    LABS/IMAGING: No results found for this or any previous visit (from the past 48 hour(s)). No results found.  WEIGHTS: Wt Readings from Last 3 Encounters:  11/05/14 196 lb (88.905 kg)  07/02/14 197 lb (89.359 kg)  11/20/13 199 lb (90.266 kg)    VITALS: BP 122/80 mmHg  Pulse 117  Ht  (1.854 m)  Wt 196 lb (88.905 kg)  BMI 25.86 kg/m2  EXAM: General appearance: alert and no distress Neck: no carotid bruit and no JVD Lungs: clear to auscultation bilaterally Heart: Regular tachycardia, no murmur Abdomen: soft, non-tender; bowel sounds normal; no masses,  no organomegaly Extremities: extremities normal, atraumatic, no  cyanosis or edema Pulses: 2+ and symmetric Skin: Skin color, texture, turgor normal. No rashes or lesions Neurologic: Grossly normal Psych: Mildly anxious  EKG: Sinus tachycardia at 117, no ischemic changes  ASSESSMENT: 1. Inappropriate sinus tachycardia 2. Hypertension-controlled  PLAN: 1.   Mr. Helm likely has an inappropriate sinus tachycardia, which is probably sympathetically driven or may be due to low vagal tone. This is actually fairly commonly seen with anxiety and hypervigilance which from my brief interaction with him seems to characterize his personality. I'm not convinced that his tachycardia is actually harmful for him as serial echocardiograms a failed to show any cardiomyopathy. It's questionable whether he symptomatic, but may be at very high heart rates with significant exertion. He really denies any significant shortness of breath or chest pain symptoms. We discussed some theories about whether or not rapid heart rates ultimately lead to a shorter lifespan, and how many cardiologists advocate for lowering heart rates. I do think if he feels somewhat symptomatic that considering adding a beta blocker may be helpful. He seemed to tolerate propranolol in the past, therefore will start a trial of propranolol XR 60 mg daily. I do not think this will significantly lower his blood pressure, but we may need to downward adjust his lisinopril. Plan to see him back in one month for follow-up.  Thanks for the kind referral.  Chrystie Nose, MD, [facility] Attending Cardiologist [facility]  Lisette Abu [facility] 11/05/2014, 3:08 PM

## 2014-11-05 NOTE — Patient Instructions (Signed)
Your physician has recommended you make the following change in your medication: START PROPRANOLOL ER  DAILY  Your physician recommends that you schedule a follow-up appointment in: 1 MONTH

## 2014-11-16 ENCOUNTER — Encounter: Payer: Self-pay | Admitting: Family Medicine

## 2014-11-18 ENCOUNTER — Encounter: Payer: BLUE CROSS/BLUE SHIELD | Admitting: Family Medicine

## 2014-12-10 ENCOUNTER — Encounter: Payer: Self-pay | Admitting: Internal Medicine

## 2014-12-10 ENCOUNTER — Ambulatory Visit (INDEPENDENT_AMBULATORY_CARE_PROVIDER_SITE_OTHER): Payer: BLUE CROSS/BLUE SHIELD | Admitting: Internal Medicine

## 2014-12-10 VITALS — BP 124/76 | HR 87 | Ht 73.0 in | Wt 198.5 lb

## 2014-12-10 DIAGNOSIS — R000 Tachycardia, unspecified: Secondary | ICD-10-CM | POA: Diagnosis not present

## 2014-12-10 DIAGNOSIS — I10 Essential (primary) hypertension: Secondary | ICD-10-CM | POA: Diagnosis not present

## 2014-12-10 NOTE — Patient Instructions (Signed)
Your physician recommends that you schedule a follow-up appointment as needed.   Please get your future refills for propranolol from your primary care doctor.

## 2014-12-10 NOTE — Progress Notes (Signed)
OFFICE NOTE  Chief Complaint:  No complaints  Primary Care Physician: Joneen Boers, MD  HPI:  Jesse Stevenson is a pleasant 44 year old male is currently referred to me for evaluation of tachycardia. He also has a history of hypertension and chronic low back pain on a Vicodin. After brief discussion he tells me that he has tachycardia pretty much for his whole life. He remembers as a teenager having heart rates in the 90s and that his heart rate is often times in the low 110-120 range. He also has a daughter who is tachycardic at rest. He describes some significant anxiety at times and previously saw psychiatrist. One point he was playing p.m. on having difficulty and took propranolol with some improvement in heart rate, but then was taken off the medicine or stopped taking it for unknown reasons. Recently seen by primary care provider noted the tachycardia and felt that additional workup was necessary. Jesse Stevenson has no chest pain, no shortness of breath or other associated symptoms. Over the years she's had 2 echocardiograms, the first showed possible mitral valve prolapse however that was many years ago and subsequently an echo demonstrated that there was no significant prolapse by current guidelines. He's also had stress testing in the past which was negative. He's never had any presyncope or syncopal episodes. The only time he is aware of his heart racing is that occasionally gets up much over 130, particularly when he is significantly exerting himself. Despite the long-standing tachycardia, he has not developed cardiomyopathy based on his 2 echocardiograms.  Jesse Stevenson returns today for follow-up. He seems to have had improvement in his symptoms on propranolol. He denies any significant palpitations and notes that his heart rate has improved significantly. His blood pressure is now at goal. His only concern is he does get some fatigue on the beta blocker which is not significantly  improved since he started taking it.  PMHx:  Past Medical History  Diagnosis Date  . HTN (hypertension)   . Mitral valve prolapse     Past Surgical History  Procedure Laterality Date  . Hernia repair    . Tonsillectomy and adenoidectomy  02/13/1978    FAMHx:  Family History  Problem Relation Age of Onset  . Hypertension Mother   . Heart disease Maternal Grandfather     SOCHx:   reports that he has never smoked. He does not have any smokeless tobacco history on file. He reports that he drinks about 1.8 - 3.0 oz of alcohol per week. He reports that he does not use illicit drugs.  ALLERGIES:  Allergies  Allergen Reactions  . Etodolac Nausea And Vomiting    Pt states, "that medication made me sick".  . Aspirin Rash    ROS: A comprehensive review of systems was negative.  HOME MEDS: Current Outpatient Prescriptions  Medication Sig Dispense Refill  . carisoprodol (SOMA) 350 MG tablet TAKE ONE TABLET BY MOUTH ONCE TO TWICE DAILY AS NEEDED FOR MUSCLE SPASM 60 tablet 5  . EPINEPHrine 0.3 mg/0.3 mL IJ SOAJ injection Inject 0.3 mLs (0.3 mg total) into the muscle once. 2 Device 1  . HYDROcodone-acetaminophen (NORCO) 5-325 MG per tablet Take 0.5 tablets by mouth 3 (three) times daily as needed for severe pain. Take during the waking hours only. Take no more than 1/2 tab for severe pain twice daily. 30 tablet 0  . lisinopril-hydrochlorothiazide (PRINZIDE,ZESTORETIC) 20-25 MG per tablet Take 1 tablet by mouth daily. 90 tablet 3  . propranolol  ER (INDERAL LA) 60 MG 24 hr capsule Take 1 capsule (60 mg total) by mouth daily. 30 capsule 6   No current facility-administered medications for this visit.    LABS/IMAGING: No results found for this or any previous visit (from the past 48 hour(s)). No results found.  WEIGHTS: Wt Readings from Last 3 Encounters:  12/10/14 198 lb 8 oz (90.039 kg)  11/05/14 196 lb (88.905 kg)  07/02/14 197 lb (89.359 kg)    VITALS: BP 124/76 mmHg  Pulse  87  Ht 6\' 1"  (1.854 m)  Wt 198 lb 8 oz (90.039 kg)  BMI 26.19 kg/m2  SpO2 97%  EXAM:  deferred deferred  EKG:  deferred  ASSESSMENT: 1. Inappropriate sinus tachycardia -  Improved on propranolol 2. Hypertension-controlled  PLAN: 1.   Jesse Stevenson likely has an inappropriate sinus tachycardia, which is probably sympathetically driven or may be due to low vagal tone. This is actually fairly commonly seen with anxiety and hypervigilance which from my brief interaction with him seems to characterize his personality. I'm not convinced that his tachycardia is actually harmful for him as serial echocardiograms a failed to show any cardiomyopathy. It's questionable whether he symptomatic, but may be at very high heart rates with significant exertion. He really denies any significant shortness of breath or chest pain symptoms. He has noticed improvement on the propranolol although is somewhat fatigued. Heart rate is improved and blood pressures at goal. I recommend continuing the current medicine and he may wish to consider taking it at night to try to  Improved some of his fatigue.  Thanks again for the kind referral.  He can follow-up with me as needed and refills of his medications could be through his primary care provider.  Chrystie NoseKenneth C. Scarlet Abad, MD, [facility] Attending Cardiologist [facility]  Chrystie NoseKenneth C Allyana Vogan 12/10/2014, 11:55 AM

## 2014-12-23 ENCOUNTER — Encounter: Payer: Self-pay | Admitting: Family Medicine

## 2014-12-23 ENCOUNTER — Ambulatory Visit (INDEPENDENT_AMBULATORY_CARE_PROVIDER_SITE_OTHER): Payer: BLUE CROSS/BLUE SHIELD | Admitting: Family Medicine

## 2014-12-23 VITALS — BP 114/90 | HR 88 | Temp 98.0°F | Resp 16 | Ht 75.75 in | Wt 194.6 lb

## 2014-12-23 DIAGNOSIS — I10 Essential (primary) hypertension: Secondary | ICD-10-CM | POA: Diagnosis not present

## 2014-12-23 DIAGNOSIS — Z0000 Encounter for general adult medical examination without abnormal findings: Secondary | ICD-10-CM

## 2014-12-23 DIAGNOSIS — Z131 Encounter for screening for diabetes mellitus: Secondary | ICD-10-CM | POA: Diagnosis not present

## 2014-12-23 DIAGNOSIS — Z130 Encounter for screening for diseases of the blood and blood-forming organs and certain disorders involving the immune mechanism: Secondary | ICD-10-CM

## 2014-12-23 DIAGNOSIS — Z13220 Encounter for screening for lipoid disorders: Secondary | ICD-10-CM | POA: Diagnosis not present

## 2014-12-23 NOTE — Progress Notes (Signed)
[facility] [address] [place] [address] [phone]  Date:  12/23/2014   Name:  Jesse Stevenson   DOB:  12/11/1970   MRN:  [id number]  PCP:  Joneen Boers, MD    Chief Complaint: Annual Exam   History of Present Illness:  Jesse Stevenson is a 44 y.o. very pleasant male patient who presents with the following:  Here today for a CPE-  He started propranolol for tachycardia per cardiology; he is taking this daily.  He does feel like this helps with his sx but can make him a bit tired.    Dr. 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   Last labs about a year ago He is not fasting today- he would prefer to do labs another day Tetanus is due next year  Overall he is doing well, still working as a Charity fundraiser.  His daughter is a Fish farm manager and is looking at colleges for next year He has been checking his BP and pulse at home; he tends to run 110- 120/ 70- 80  Patient Active Problem List   Diagnosis Date Noted  . Inappropriate sinus tachycardia (HCC) 11/05/2014  . Scoliosis of thoracic spine 06/20/2011  . Mucocutaneous candidiasis 10/31/2010  . HTN (hypertension) 10/31/2010    Past Medical History  Diagnosis Date  . HTN (hypertension)   . Mitral valve prolapse     Past Surgical History  Procedure Laterality Date  . Hernia repair    . Tonsillectomy and adenoidectomy  02/13/1978    Social History  Substance Use Topics  . Smoking status: Never Smoker   . Smokeless tobacco: None  . Alcohol Use: 1.8 - 3.0 oz/week    3-5 Glasses of wine per week    Family History  Problem Relation Age of Onset  . Hypertension Mother   . Heart disease Maternal Grandfather     Allergies  Allergen Reactions  . Etodolac Nausea And Vomiting    Pt states, "that medication made me sick".  . Aspirin Rash    Medication list has been reviewed and updated.  Current Outpatient Prescriptions on File Prior to Visit   Medication Sig Dispense Refill  . carisoprodol (SOMA) 350 MG tablet TAKE ONE TABLET BY MOUTH ONCE TO TWICE DAILY AS NEEDED FOR MUSCLE SPASM 60 tablet 5  . EPINEPHrine 0.3 mg/0.3 mL IJ SOAJ injection Inject 0.3 mLs (0.3 mg total) into the muscle once. 2 Device 1  . HYDROcodone-acetaminophen (NORCO) 5-325 MG per tablet Take 0.5 tablets by mouth 3 (three) times daily as needed for severe pain. Take during the waking hours only. Take no more than 1/2 tab for severe pain twice daily. 30 tablet 0  . lisinopril-hydrochlorothiazide (PRINZIDE,ZESTORETIC) 20-25 MG per tablet Take 1 tablet by mouth daily. 90 tablet 3  . propranolol ER (INDERAL LA) 60 MG 24 hr capsule Take 1 capsule (60 mg total) by mouth daily. 30 capsule 6   No current facility-administered medications on file prior to visit.    Review of Systems:  As per HPI- otherwise negative.   Physical Examination: Filed Vitals:   12/23/14 1337  BP: 114/90  Pulse: 88  Temp: 98 F (36.7 C)  Resp: 16   Filed Vitals:   12/23/14 1337  Height: 6' 3.75" (1.924 m)  Weight: 194 lb 9.6 oz (88.27 kg)   Body mass index is 23.85 kg/(m^2). Ideal Body Weight: Weight in (lb) to  have BMI = 25: 203.6  GEN: WDWN, NAD, Non-toxic, A & O x 3, looks well HEENT: Atraumatic, Normocephalic. Neck supple. No masses, No LAD. Bilateral TM wnl, oropharynx normal.  PEERL,EOMI.   Ears and Nose: No external deformity. CV: RRR, No M/G/R. No JVD. No thrill. No extra heart sounds. PULM: CTA B, no wheezes, crackles, rhonchi. No retractions. No resp. distress. No accessory muscle use. ABD: S, NT, ND. No rebound. No HSM. EXTR: No c/c/e NEURO Normal gait.  PSYCH: Normally interactive. Conversant. Not depressed or anxious appearing.  Calm demeanor.    Assessment and Plan: Physical exam  Essential hypertension  Screening for diabetes mellitus - Plan: Comprehensive metabolic panel, Hemoglobin A1c  Screening for hyperlipidemia - Plan: Lipid panel  Screening for  deficiency anemia - Plan: CBC  Physical today- he is doing well overall.  Prefers to do labs another day- he will come back for this Will decrease his lisinopril/ hctz as his BP is well controlled We will refill his propranolol from here on out as he does not need to continue to follow-up with cardiology Tetanus due next year- advised booster sooner if any injury   Signed Abbe AmsterdamJessica Copland, MD

## 2014-12-23 NOTE — Addendum Note (Signed)
Addended by: Abbe AmsterdamOPLAND, Chaye Misch C on: 12/23/2014 02:14 PM   Modules accepted: Orders

## 2014-12-23 NOTE — Patient Instructions (Signed)
It was good to see you today!  Try breaking your lisinopril/ hctz in half.  If your blood pressure starts to run higher than 140/90 let me know  Come back at your convenience for labs- please try to be fasting for your blood work

## 2014-12-31 ENCOUNTER — Other Ambulatory Visit: Payer: Self-pay

## 2014-12-31 MED ORDER — LISINOPRIL-HYDROCHLOROTHIAZIDE 20-25 MG PO TABS: 0.5000 | ORAL_TABLET | Freq: Every day | ORAL | Status: DC

## 2014-12-31 NOTE — Telephone Encounter (Signed)
Dose decreased at 12/23/14 OV. Sent in new Rx sig.

## 2015-01-02 ENCOUNTER — Encounter: Payer: Self-pay | Admitting: Family Medicine

## 2015-01-02 DIAGNOSIS — I10 Essential (primary) hypertension: Secondary | ICD-10-CM

## 2015-01-04 MED ORDER — LISINOPRIL-HYDROCHLOROTHIAZIDE 10-12.5 MG PO TABS: 1.0000 | ORAL_TABLET | Freq: Every day | ORAL | Status: DC

## 2015-01-05 ENCOUNTER — Other Ambulatory Visit: Payer: Self-pay

## 2015-01-05 DIAGNOSIS — I10 Essential (primary) hypertension: Secondary | ICD-10-CM

## 2015-01-05 MED ORDER — LISINOPRIL-HYDROCHLOROTHIAZIDE 10-12.5 MG PO TABS: 1.0000 | ORAL_TABLET | Freq: Every day | ORAL | Status: DC

## 2015-01-06 ENCOUNTER — Other Ambulatory Visit: Payer: Self-pay

## 2015-01-06 DIAGNOSIS — I10 Essential (primary) hypertension: Secondary | ICD-10-CM

## 2015-01-06 MED ORDER — LISINOPRIL-HYDROCHLOROTHIAZIDE 10-12.5 MG PO TABS: 1.0000 | ORAL_TABLET | Freq: Every day | ORAL | Status: DC

## 2015-05-15 ENCOUNTER — Other Ambulatory Visit: Payer: Self-pay | Admitting: Internal Medicine

## 2015-05-17 NOTE — Telephone Encounter (Signed)
Rx(s) sent to pharmacy electronically.  

## 2015-05-19 ENCOUNTER — Other Ambulatory Visit: Payer: Self-pay

## 2015-05-19 MED ORDER — PROPRANOLOL HCL ER 60 MG PO CP24: 60.0000 mg | ORAL_CAPSULE | Freq: Every day | ORAL | Status: DC

## 2015-05-21 ENCOUNTER — Telehealth: Payer: Self-pay | Admitting: Internal Medicine

## 2015-05-21 NOTE — Telephone Encounter (Signed)
I can see her .Marland Kitchen. I suspect she has similar symptoms as her father.  Dr. HRexene Edison

## 2015-05-21 NOTE — Telephone Encounter (Signed)
Pt calling to see if his daughter may be seen. She is 45 years old. She has similar issues w/ high HR as patient, pt notes her resting pulse rate is high, sometimes standing is 150s-160s. She has been eval'd by PCP and followed to their capacity. He would like to know if Dr. Rennis GoldenHilty would take her on as new patient. Aware I will defer for Dr. Blanchie DessertHilty's OK or recommendation.

## 2015-05-21 NOTE — Telephone Encounter (Signed)
NEW MESSAGE   Pt is calling he wants to make his 45 year old daughter a pt of Dr.Hilty she has a fast heart beat  Pt is requesting rn call him back to see if it is possible she will be 17 in October

## 2015-05-21 NOTE — Telephone Encounter (Signed)
Informed patient Dr. Rennis GoldenHilty would be OK to evaluate daughter as new patient. Demographic information taken and given to Deedie who will contact to schedule.

## 2015-12-21 ENCOUNTER — Other Ambulatory Visit: Payer: Self-pay | Admitting: Urgent Care

## 2016-01-04 ENCOUNTER — Ambulatory Visit (INDEPENDENT_AMBULATORY_CARE_PROVIDER_SITE_OTHER): Payer: BLUE CROSS/BLUE SHIELD | Admitting: Physician Assistant

## 2016-01-04 ENCOUNTER — Encounter: Payer: Self-pay | Admitting: Physician Assistant

## 2016-01-04 VITALS — BP 116/74 | HR 94 | Temp 97.5°F | Resp 18 | Ht 75.0 in | Wt 204.2 lb

## 2016-01-04 DIAGNOSIS — Z125 Encounter for screening for malignant neoplasm of prostate: Secondary | ICD-10-CM | POA: Diagnosis not present

## 2016-01-04 DIAGNOSIS — Z0000 Encounter for general adult medical examination without abnormal findings: Secondary | ICD-10-CM

## 2016-01-04 DIAGNOSIS — Z13220 Encounter for screening for lipoid disorders: Secondary | ICD-10-CM

## 2016-01-04 DIAGNOSIS — I4711 Inappropriate sinus tachycardia, so stated: Secondary | ICD-10-CM

## 2016-01-04 DIAGNOSIS — R000 Tachycardia, unspecified: Secondary | ICD-10-CM

## 2016-01-04 DIAGNOSIS — I10 Essential (primary) hypertension: Secondary | ICD-10-CM | POA: Diagnosis not present

## 2016-01-04 LAB — CBC WITH DIFFERENTIAL/PLATELET
BASOS PCT: 1 %
Basophils Absolute: 66 cells/uL (ref 0–200)
EOS PCT: 2 %
Eosinophils Absolute: 132 cells/uL (ref 15–500)
HCT: 47.3 % (ref 38.5–50.0)
Hemoglobin: 15.9 g/dL (ref 13.2–17.1)
LYMPHS PCT: 20 %
Lymphs Abs: 1320 cells/uL (ref 850–3900)
MCH: 29.4 pg (ref 27.0–33.0)
MCHC: 33.6 g/dL (ref 32.0–36.0)
MCV: 87.6 fL (ref 80.0–100.0)
MONO ABS: 660 {cells}/uL (ref 200–950)
MONOS PCT: 10 %
MPV: 9.8 fL (ref 7.5–12.5)
NEUTROS PCT: 67 %
Neutro Abs: 4422 cells/uL (ref 1500–7800)
PLATELETS: 297 10*3/uL (ref 140–400)
RBC: 5.4 MIL/uL (ref 4.20–5.80)
RDW: 13.7 % (ref 11.0–15.0)
WBC: 6.6 10*3/uL (ref 3.8–10.8)

## 2016-01-04 LAB — COMPREHENSIVE METABOLIC PANEL
ALK PHOS: 55 U/L (ref 40–115)
ALT: 37 U/L (ref 9–46)
AST: 29 U/L (ref 10–40)
Albumin: 4.7 g/dL (ref 3.6–5.1)
BILIRUBIN TOTAL: 0.5 mg/dL (ref 0.2–1.2)
BUN: 14 mg/dL (ref 7–25)
CO2: 29 mmol/L (ref 20–31)
Calcium: 9.6 mg/dL (ref 8.6–10.3)
Chloride: 101 mmol/L (ref 98–110)
Creat: 0.84 mg/dL (ref 0.60–1.35)
GLUCOSE: 85 mg/dL (ref 65–99)
Potassium: 4.8 mmol/L (ref 3.5–5.3)
SODIUM: 137 mmol/L (ref 135–146)
Total Protein: 6.9 g/dL (ref 6.1–8.1)

## 2016-01-04 LAB — LIPID PANEL
Cholesterol: 256 mg/dL — ABNORMAL HIGH (ref <200)
HDL: 72 mg/dL (ref >40)
LDL CALC: 158 mg/dL — AB (ref <100)
Total CHOL/HDL Ratio: 3.6 Ratio (ref <5.0)
Triglycerides: 130 mg/dL (ref <150)
VLDL: 26 mg/dL (ref <30)

## 2016-01-04 LAB — TSH: TSH: 2.95 m[IU]/L (ref 0.40–4.50)

## 2016-01-04 LAB — PSA: PSA: 0.5 ng/mL (ref <=4.0)

## 2016-01-04 MED ORDER — PROPRANOLOL HCL ER 60 MG PO CP24: 60.0000 mg | ORAL_CAPSULE | Freq: Every day | ORAL | 3 refills | Status: DC

## 2016-01-04 MED ORDER — EPINEPHRINE 0.3 MG/0.3ML IJ SOAJ: 0.3000 mg | Freq: Once | INTRAMUSCULAR | 1 refills | Status: AC

## 2016-01-04 MED ORDER — LISINOPRIL-HYDROCHLOROTHIAZIDE 10-12.5 MG PO TABS: 1.0000 | ORAL_TABLET | Freq: Every day | ORAL | 3 refills | Status: DC

## 2016-01-04 NOTE — Patient Instructions (Addendum)
   IF you received an x-ray today, you will receive an invoice from [facility]. Please contact [facility] at [phone] with questions or concerns regarding your invoice.   IF you received labwork today, you will receive an invoice from [facility]. Please contact [facility] at [phone] with questions or concerns regarding your invoice.   Our billing staff will not be able to assist you with questions regarding bills from these companies.  You will be contacted with the lab results as soon as they are available. The fastest way to get your results is to activate your My Chart account. Instructions are located on the last page of this paperwork. If you have not heard from us regarding the results in 2 weeks, please contact this office.    Keeping you healthy  Get these tests  Blood pressure- Have your blood pressure checked once a year by your healthcare provider.  Normal blood pressure is 120/80.  Weight- Have your body mass index (BMI) calculated to screen for obesity.  BMI is a measure of body fat based on height and weight. You can also calculate your own BMI at www.nhlbisupport.com/bmi/.  Cholesterol- Have your cholesterol checked regularly starting at age 35, sooner may be necessary if you have diabetes, high blood pressure, if a family member developed heart diseases at an early age or if you smoke.   Chlamydia, HIV, and other sexual transmitted disease- Get screened each year until the age of 25 then within three months of each new sexual partner.  Diabetes- Have your blood sugar checked regularly if you have high blood pressure, high cholesterol, a family history of diabetes or if you are overweight.  Get these vaccines  Flu shot- Every fall.  Tetanus shot- Every 10 years.  Menactra- Single dose; prevents meningitis.  Take these steps  Don't smoke- If you do smoke, ask your healthcare provider about quitting. For tips on  how to quit, go to www.smokefree.gov or call 1-800-QUIT-NOW.  Be physically active- Exercise 5 days a week for at least 30 minutes.  If you are not already physically active start slow and gradually work up to 30 minutes of moderate physical activity.  Examples of moderate activity include walking briskly, mowing the yard, dancing, swimming bicycling, etc.  Eat a healthy diet- Eat a variety of healthy foods such as fruits, vegetables, low fat milk, low fat cheese, yogurt, lean meats, poultry, fish, beans, tofu, etc.  For more information on healthy eating, go to www.thenutritionsource.org  Drink alcohol in moderation- Limit alcohol intake two drinks or less a day.  Never drink and drive.  Dentist- Brush and floss teeth twice daily; visit your dentis twice a year.  Depression-Your emotional health is as important as your physical health.  If you're feeling down, losing interest in things you normally enjoy please talk with your healthcare provider.  Gun Safety- If you keep a gun in your home, keep it unloaded and with the safety lock on.  Bullets should be stored separately.  Helmet use- Always wear a helmet when riding a motorcycle, bicycle, rollerblading or skateboarding.  Safe sex- If you may be exposed to a sexually transmitted infection, use a condom  Seat belts- Seat bels can save your life; always wear one.  Smoke/Carbon Monoxide detectors- These detectors need to be installed on the appropriate level of your home.  Replace batteries at least once a year.  Skin Cancer- When out in the sun, cover up and use sunscreen SPF   15 or higher.  Violence- If anyone is threatening or hurting you, please tell your healthcare provider. 

## 2016-01-04 NOTE — Progress Notes (Signed)
Patient ID: Jesse Stevenson, male    DOB: 1970/12/31, 45 y.o.   MRN: [id number]  PCP: Jesse Oarhelle Javan Gonzaga, PA-C, as of today  Chief Complaint  Patient presents with  . Annual Exam    Patient Care Team: Jesse Oarhelle Jesse Northington, PA-C as PCP - General (Family Medicine) Ricki MillerAnthony H Wheeler, MD as Referring Physician (Pain Medicine) Chrystie NoseKenneth C Hilty, MD as Consulting Physician (Cardiology)   Subjective:   Presents for Annual Wellness Visit.  Colorectal Cancer Screening: not yet a candidate Prostate Cancer Screening: not yet Bone Density Testing: not yet a candidate HIV Screening: completed (by patient history, approximately 2008) STI Screening: very low risk Seasonal Influenza Vaccination: declined Td/Tdap Vaccination: 2007. Declines today, doesn't want a sore arm during the holiday. Pneumococcal Vaccination: not yet a candidate Zoster Vaccination: not yet a candidate Frequency of Dental evaluation: Q6 months Frequency of Eye evaluation: Q2-3 years  Daughter, a freshman at [employer] has been diagnosed with Bipolar disorder. 46 year old son lives at home.   Patient Active Problem List   Diagnosis Date Noted  . Inappropriate sinus tachycardia 11/05/2014  . Scoliosis of thoracic spine 06/20/2011  . Mucocutaneous candidiasis 10/31/2010  . HTN (hypertension) 10/31/2010    Past Medical History:  Diagnosis Date  . HTN (hypertension)   . Mitral valve prolapse      Prior to Admission medications   Medication Sig Start Date End Date Taking? Authorizing Provider  carisoprodol (SOMA) 350 MG tablet TAKE ONE TABLET BY MOUTH ONCE TO TWICE DAILY AS NEEDED FOR MUSCLE SPASM 05/05/14  Yes Maurice MarchBarbara B McPherson, MD  EPINEPHrine 0.3 mg/0.3 mL IJ SOAJ injection Inject 0.3 mLs (0.3 mg total) into the muscle once. 06/26/14  Yes Morrell RiddleSarah L Weber, PA-C  HYDROcodone-acetaminophen (NORCO) 5-325 MG per tablet Take 0.5 tablets by mouth 3 (three) times daily as needed for severe pain. Take during the waking hours only.  Take no more than 1/2 tab for severe pain twice daily. 08/05/14  Yes Tishira R Brewington, PA-C  lisinopril-hydrochlorothiazide (PRINZIDE,ZESTORETIC) 10-12.5 MG tablet Take 1 tablet by mouth daily. 01/06/15  Yes Gwenlyn FoundJessica C Copland, MD  propranolol ER (INDERAL LA) 60 MG 24 hr capsule Take 1 capsule (60 mg total) by mouth daily. 05/19/15  Yes Wallis BambergMario Mani, PA-C    Allergies  Allergen Reactions  . Etodolac Nausea And Vomiting    Pt states, "that medication made me sick".  . Aspirin Rash    Past Surgical History:  Procedure Laterality Date  . HERNIA REPAIR    . TONSILLECTOMY AND ADENOIDECTOMY  02/13/1978    Family History  Problem Relation Age of Onset  . Hypertension Mother   . Heart disease Maternal Grandfather     Social History   Social History  . Marital status: Married    Spouse name: N/A  . Number of children: N/A  . Years of education: N/A   Occupational History  . 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 Smoking status: Never Smoker  . Smokeless tobacco: None  . Alcohol use 1.8 - 3.0 oz/week    3 - 5 Glasses of wine per week  . Drug use: No  . Sexual activity: Not Asked   Other Topics Concern  . None   Social History Narrative   Married. Education: Regulatory affairs officerCollege/Grad School. Exercise: No.       Review of Systems  Constitutional: Negative.   HENT: Negative.   Eyes: Negative.   Respiratory: Negative.  Cardiovascular: Negative.   Gastrointestinal: Negative.   Genitourinary: Negative.   Musculoskeletal: Negative.   Skin: Negative.   Neurological: Negative.   Psychiatric/Behavioral: Negative.         Objective:  Physical Exam  Constitutional: He is oriented to person, place, and time. He appears well-developed and well-nourished. He is active and cooperative. No distress.  BP 116/74   Pulse 94   Temp 97.5 F (36.4 C) (Oral)   Resp 18   Ht 6\' 3"  (1.905 m)   Wt 204 lb 3.2 oz (92.6 kg)   SpO2 98%   BMI 25.52 kg/m   HENT:    Head: Normocephalic and atraumatic.  Right Ear: Hearing normal.  Left Ear: Hearing normal.  Eyes: Conjunctivae are normal. No scleral icterus.  Neck: Normal range of motion. Neck supple. No thyromegaly present.  Cardiovascular: Normal rate, regular rhythm and normal heart sounds.   Pulses:      Radial pulses are 2+ on the right side, and 2+ on the left side.  Pulmonary/Chest: Effort normal and breath sounds normal.  Abdominal: Bowel sounds are normal. He exhibits no distension and no mass. There is no tenderness. There is no rebound and no guarding.  Lymphadenopathy:       Head (right side): No tonsillar, no preauricular, no posterior auricular and no occipital adenopathy present.       Head (left side): No tonsillar, no preauricular, no posterior auricular and no occipital adenopathy present.    He has no cervical adenopathy.       Right: No supraclavicular adenopathy present.       Left: No supraclavicular adenopathy present.  Neurological: He is alert and oriented to person, place, and time. No sensory deficit.  Skin: Skin is warm, dry and intact. No rash noted. No cyanosis or erythema. Nails show no clubbing.  Psychiatric: He has a normal mood and affect. His speech is normal and behavior is normal.           Assessment & Plan:  1. Annual physical exam Age appropriate anticipatory guidance provided. - EPINEPHrine 0.3 mg/0.3 mL IJ SOAJ injection; Inject 0.3 mLs (0.3 mg total) into the muscle once.  Dispense: 2 Device; Refill: 1  2. Essential hypertension Controlled. Continue current regimen. - CBC with Differential/Platelet - Comprehensive metabolic panel - TSH - propranolol ER (INDERAL LA) 60 MG 24 hr capsule; Take 1 capsule (60 mg total) by mouth daily.  Dispense: 90 capsule; Refill: 3 - lisinopril-hydrochlorothiazide (PRINZIDE,ZESTORETIC) 10-12.5 MG tablet; Take 1 tablet by mouth daily.  Dispense: 90 tablet; Refill: 3  3. Inappropriate sinus tachycardia Controlled.  COntinue current regimen. - propranolol ER (INDERAL LA) 60 MG 24 hr capsule; Take 1 capsule (60 mg total) by mouth daily.  Dispense: 90 capsule; Refill: 3  4. Screening for hyperlipidemia - Lipid panel  5. Screening for prostate cancer - PSA   Return in about 1 year (around 01/03/2017) for re-evaluation of blood pressure.   Fernande Brashelle S. Rohith Fauth, PA-C Physician Assistant-Certified [facility]

## 2016-12-23 ENCOUNTER — Other Ambulatory Visit: Payer: Self-pay | Admitting: Physician Assistant

## 2016-12-23 DIAGNOSIS — R000 Tachycardia, unspecified: Secondary | ICD-10-CM

## 2016-12-23 DIAGNOSIS — I10 Essential (primary) hypertension: Secondary | ICD-10-CM

## 2016-12-25 ENCOUNTER — Other Ambulatory Visit: Payer: Self-pay | Admitting: *Deleted

## 2016-12-25 ENCOUNTER — Other Ambulatory Visit: Payer: Self-pay

## 2016-12-25 ENCOUNTER — Telehealth: Payer: Self-pay | Admitting: Physician Assistant

## 2016-12-25 DIAGNOSIS — R000 Tachycardia, unspecified: Secondary | ICD-10-CM

## 2016-12-25 DIAGNOSIS — I10 Essential (primary) hypertension: Secondary | ICD-10-CM

## 2016-12-25 MED ORDER — PROPRANOLOL HCL ER 60 MG PO CP24: 60.0000 mg | ORAL_CAPSULE | Freq: Every day | ORAL | 0 refills | Status: DC

## 2016-12-25 MED ORDER — LISINOPRIL-HYDROCHLOROTHIAZIDE 10-12.5 MG PO TABS: 1.0000 | ORAL_TABLET | Freq: Every day | ORAL | 1 refills | Status: DC

## 2016-12-25 NOTE — Telephone Encounter (Signed)
Copied from CRM [id number]. Topic: Quick Communication - See Telephone Encounter >> Dec 25, 2016 12:03 PM Terisa Starraylor, Brittany L wrote: CRM for notification. See Telephone encounter for:   Patient needs a refill on [facility] , he said he got an email saying that script was delayed for LISINOPRIL to [facility], he said he does not use [facility] anymore so I took that off of his chart. he said he needs the [facility] 12/25/16.

## 2016-12-25 NOTE — Progress Notes (Signed)
Patient said that the pharmacy called him at 12:45 and said he needed an appt. Looking at snapshot its saying that the pharmacy confirmed the script.

## 2017-01-16 ENCOUNTER — Encounter: Payer: Self-pay | Admitting: Physician Assistant

## 2017-01-16 ENCOUNTER — Ambulatory Visit (INDEPENDENT_AMBULATORY_CARE_PROVIDER_SITE_OTHER): Payer: BLUE CROSS/BLUE SHIELD | Admitting: Physician Assistant

## 2017-01-16 ENCOUNTER — Other Ambulatory Visit: Payer: Self-pay

## 2017-01-16 VITALS — BP 118/78 | HR 90 | Temp 97.9°F | Resp 16 | Ht 76.97 in | Wt 207.0 lb

## 2017-01-16 DIAGNOSIS — Z23 Encounter for immunization: Secondary | ICD-10-CM | POA: Diagnosis not present

## 2017-01-16 DIAGNOSIS — R351 Nocturia: Secondary | ICD-10-CM | POA: Diagnosis not present

## 2017-01-16 DIAGNOSIS — Z0000 Encounter for general adult medical examination without abnormal findings: Secondary | ICD-10-CM

## 2017-01-16 DIAGNOSIS — I10 Essential (primary) hypertension: Secondary | ICD-10-CM | POA: Diagnosis not present

## 2017-01-16 DIAGNOSIS — R000 Tachycardia, unspecified: Secondary | ICD-10-CM

## 2017-01-16 DIAGNOSIS — E782 Mixed hyperlipidemia: Secondary | ICD-10-CM | POA: Diagnosis not present

## 2017-01-16 MED ORDER — LISINOPRIL-HYDROCHLOROTHIAZIDE 10-12.5 MG PO TABS: 1.0000 | ORAL_TABLET | Freq: Every day | ORAL | 3 refills | Status: DC

## 2017-01-16 MED ORDER — PROPRANOLOL HCL ER 60 MG PO CP24: 60.0000 mg | ORAL_CAPSULE | Freq: Every day | ORAL | 3 refills | Status: DC

## 2017-01-16 NOTE — Progress Notes (Signed)
Chief Complaint  Patient presents with  . Annual Exam   Subjective:    Patient ID: Jesse Stevenson, male    DOB: 03-10-1970, 46 y.o.   MRN: [id number]  HPI Mr. Cutler is a 46 year old Caucasian male with a past medical history significant for hyprtension who presents for an annual physical exam. He states that he has been doing well since his last physical an year ago.   He states that he has noticed that his pulse has been elevated over the last year (90-120) based on his Fitbit readings. This happens on a weekly basis. His only symptom is feeling his heart racing. No associated chest pain or shortness of breath.   Patient checks his blood pressure daily and readings have been around 115-125/75-85. He usually eats two meals a day. He eats out for lunch and eats home cooked meals for dinner. He admits to late night snacks which are usually unhealthy choices. Patient does not exercise.  He is willing to start walking in his neighborhood or use his stationary bike at home.  Patient reports that he has been having to wake up once or twice every night to urinate. This has been going on for the last year or so. No dysuria. No blood in urine. He does not experience these symptoms during he day.  He goes to [facility], they recommended eye exams every 3 years. He states that he is overdue for an eye exam but is planning to schedule one soon. He sees Dr.Nottage for dental care every 6 months.  Review of Systems  Constitutional: Negative for appetite change, fatigue, fever and unexpected weight change.  Eyes: Positive for visual disturbance (blurry vision with near objects).  Respiratory: Negative for cough and shortness of breath.   Cardiovascular: Negative for chest pain and palpitations.  Gastrointestinal: Negative for abdominal pain, constipation, diarrhea, nausea and vomiting.  Genitourinary: Positive for frequency and urgency. Negative for dysuria and hematuria.    Musculoskeletal: Positive for back pain (on prescription pain meds for it).  Neurological: Positive for headaches (once a week. Around forehead). Negative for light-headedness.  Psychiatric/Behavioral: Positive for sleep disturbance (trouble falling asleep. Pt is on Ambien which he states is helpful).   Patient Active Problem List   Diagnosis Date Noted  . Inappropriate sinus tachycardia 11/05/2014  . Scoliosis of thoracic spine 06/20/2011  . Mucocutaneous candidiasis 10/31/2010  . HTN (hypertension) 10/31/2010   Past Medical History:  Diagnosis Date  . HTN (hypertension)   . Mitral valve prolapse    Past Surgical History:  Procedure Laterality Date  . HERNIA REPAIR    . TONSILLECTOMY AND ADENOIDECTOMY  02/13/1978   Current Outpatient Medications on File Prior to Visit  Medication Sig Dispense Refill  . carisoprodol (SOMA) 350 MG tablet TAKE ONE TABLET BY MOUTH ONCE TO TWICE DAILY AS NEEDED FOR MUSCLE SPASM 60 tablet 5  . diazepam (VALIUM) 10 MG tablet     . EPINEPHrine 0.3 mg/0.3 mL IJ SOAJ injection Inject into the muscle.    Marland Kitchen HYDROcodone-acetaminophen (NORCO) 7.5-325 MG tablet Take 1 tablet by mouth every 6 (six) hours as needed for moderate pain.    Marland Kitchen lisinopril-hydrochlorothiazide (PRINZIDE,ZESTORETIC) 10-12.5 MG tablet Take 1 tablet daily by mouth. Needs an appt before it can be refilled again. 30 tablet 1  . propranolol ER (INDERAL LA) 60 MG 24 hr capsule Take 1 capsule (60 mg total) daily by mouth. Pt needs office visit for more refills. 30 capsule 0  .  zolpidem (AMBIEN) 10 MG tablet      No current facility-administered medications on file prior to visit.    Allergies  Allergen Reactions  . Etodolac Nausea And Vomiting    Pt states, "that medication made me sick".  . Aspirin Rash   Social History   Socioeconomic History  . Marital status: Married    Spouse name: Not on file  . Number of children: 2  . Years of education: Master's degree  . Highest education  level: Not on file  Social Needs  . Financial resource strain: Not on file  . Food insecurity - worry: Not on file  . Food insecurity - inability: Not on file  . Transportation needs - medical: Not on file  . Transportation needs - non-medical: Not on file  Occupational History  . Occupation: Training and development officerresident of Company    Employer: [employer]    CommentIT consultant: biologist  Tobacco Use  . Smoking status: Never Smoker  . Smokeless tobacco: Never Used  Substance and Sexual Activity  . Alcohol use: Yes    Alcohol/week: 1.8 - 3.0 oz    Types: 3 - 5 Glasses of wine per week  . Drug use: No  . Sexual activity: Not on file  Other Topics Concern  . Not on file  Social History Narrative   Lives at home with wife and son. Daughter is in college.    Education: Regulatory affairs officerCollege/Grad School.    Exercise: No.      Objective:   Physical Exam  Constitutional: He appears well-developed and well-nourished. No distress.  HENT:  Head: Normocephalic and atraumatic.  Right Ear: External ear normal.  Left Ear: External ear normal.  Nose: Nose normal.  Mouth/Throat: Oropharynx is clear and moist.  Eyes: Conjunctivae are normal. Pupils are equal, round, and reactive to light.  Neck: Neck supple.  Cardiovascular: Normal rate, regular rhythm, normal heart sounds and intact distal pulses.  Pulmonary/Chest: Effort normal and breath sounds normal.  Abdominal: Soft. Bowel sounds are normal. There is no tenderness.  Lymphadenopathy:    He has no cervical adenopathy.  Neurological: He is alert.  Skin: Skin is warm.  Psychiatric: He has a normal mood and affect.      Assessment & Plan:    HTN:   ,order CBC w/ diff, CMP, TSH Hyperlipidemia: lipid panel Nocturia: Urinalysis- dipstick, urine culture, repeat PSA Health maintenance? Postponed Tdap, hates needles. Used to make flu shots F/u in 1 year for re-evaluation of blood pressure Refill Propranolol  1. Annual physical exam Age appropriate preventative care information  provided.   2. Essential hypertension Controlled. Continue current medication regimen. - CBC with Differential/Platelet - Comprehensive metabolic panel - TSH - propranolol ER (INDERAL LA) 60 MG 24 hr capsule; Take 1 capsule (60 mg total) by mouth daily. Pt needs office visit for more refills.  Dispense: 90 capsule; Refill: 3 - lisinopril-hydrochlorothiazide (PRINZIDE,ZESTORETIC) 10-12.5 MG tablet; Take 1 tablet by mouth daily. Needs an appt before it can be refilled again.  Dispense: 90 tablet; Refill: 3  3. Mixed hyperlipidemia Check for control. - Lipid panel  4. Nocturia Patient informed that symptoms could be a sign of an enlarged prostate. - PSA - Urinalysis, dipstick only - Urine Microscopic  5. Inappropriate sinus tachycardia Instructed patient to monitor symptoms during tachycardic episodes and watch for serious symptoms such as chest pain and shortness of breath. - propranolol ER (INDERAL LA) 60 MG 24 hr capsule; Take 1 capsule (60 mg total) by mouth daily. Pt needs  office visit for more refills.  Dispense: 90 capsule; Refill: 3  6. Need for Tdap vaccination Patient declined. Postponed until 01/16/18  7. Need for influenza vaccination Patient declined. Postponed until 09/18/17  -Follow up in 1 year for annual physical exam or sooner based on lab results.  Chatham Howington W Ducktown[place]oche, HealdtonStudent-PA

## 2017-01-16 NOTE — Progress Notes (Signed)
Patient ID: Jesse Stevenson, male    DOB: 1970/05/24, 46 y.o.   MRN: [id number]  PCP: Porfirio OarJeffery, Abhijay Morriss, PA-C  Chief Complaint  Patient presents with  . Annual Exam    Subjective:   P[facility] for [name].  Overall, he feels well.  FitBit shows elevated pulse over the past year, 120 max. No associated CP or SOB. Home BP 115-125/75-85 Not exercising regularly.  1-2 episodes of nocturia/night.  Colorectal Cancer Screening: not yet a candidate Prostate Cancer Screening: not previously tested Bone Density Testing: not yet a candidate HIV Screening: reportedly negative 12/2006. STI Screening: declines. Seasonal Influenza Vaccination: declines Td/Tdap Vaccination: 08/10/2005. Declines. Pneumococcal Vaccination: not yet a candidate Zoster Vaccination: not yet a candidate. Frequency of Dental evaluation: Q6 months Frequency of Eye evaluation: Q3 years. Overdue    Patient Active Problem List   Diagnosis Date Noted  . Inappropriate sinus tachycardia 11/05/2014  . Scoliosis of thoracic spine 06/20/2011  . Mucocutaneous candidiasis 10/31/2010  . HTN (hypertension) 10/31/2010    Past Medical History:  Diagnosis Date  . HTN (hypertension)   . Mitral valve prolapse      Prior to Admission medications   Medication Sig Start Date End Date Taking? Authorizing Provider  carisoprodol (SOMA) 350 MG tablet TAKE ONE TABLET BY MOUTH ONCE TO TWICE DAILY AS NEEDED FOR MUSCLE SPASM 05/05/14  Yes Maurice MarchMcPherson, Barbara B, MD  diazepam (VALIUM) 10 MG tablet  03/22/15  Yes [provider]  EPINEPHrine 0.3 mg/0.3 mL IJ SOAJ injection Inject into the muscle. 06/26/14  Yes [provider]  HYDROcodone-acetaminophen (NORCO) 7.5-325 MG tablet Take 1 tablet by mouth every 6 (six) hours as needed for moderate pain.   Yes [provider]  lisinopril-hydrochlorothiazide (PRINZIDE,ZESTORETIC) 10-12.5 MG tablet Take 1 tablet daily by mouth. Needs an appt before it  can be refilled again. 12/25/16 01/24/17 Yes Aleea Hendry, PA-C  propranolol ER (INDERAL LA) 60 MG 24 hr capsule Take 1 capsule (60 mg total) daily by mouth. Pt needs office visit for more refills. 12/25/16  Yes Camdan Burdi, PA-C  zolpidem (AMBIEN) 10 MG tablet  03/23/15  Yes [provider]    Allergies  Allergen Reactions  . Etodolac Nausea And Vomiting    Pt states, "that medication made me sick".  . Aspirin Rash    Past Surgical History:  Procedure Laterality Date  . HERNIA REPAIR    . TONSILLECTOMY AND ADENOIDECTOMY  02/13/1978    Family History  Problem Relation Age of Onset  . Hypertension Mother   . Hyperlipidemia Mother   . Heart disease Maternal Grandfather   . Mental illness Daughter        bipolar    Social History   Socioeconomic History  . Marital status: Married    Spouse name: None  . Number of children: 2  . Years of education: Master's degree  . Highest education level: None  Social Needs  . Financial resource strain: None  . Food insecurity - worry: None  . Food insecurity - inability: None  . Transportation needs - medical: None  . Transportation needs - non-medical: None  Occupational History  . Occupation: Training and development officerresident of Company    Employer: [employer]    CommentIT consultant: biologist  Tobacco Use  . Smoking status: Never Smoker  . Smokeless tobacco: Never Used  Substance and Sexual Activity  . Alcohol use: Yes    Alcohol/week: 1.8 - 3.0 oz    Types: 3 - 5 Glasses of wine  per week  . Drug use: No  . Sexual activity: None  Other Topics Concern  . None  Social History Narrative   Married.    Education: Regulatory affairs officer.    Exercise: No.       Review of Systems  Constitutional: Negative.   HENT: Negative.   Eyes: Negative.   Respiratory: Negative.   Cardiovascular: Positive for palpitations. Negative for chest pain and leg swelling.  Gastrointestinal: Negative.   Endocrine: Negative.   Genitourinary: Negative.   Musculoskeletal:  Negative.   Skin: Negative.   Allergic/Immunologic: Negative.   Neurological: Negative.   Hematological: Negative.   Psychiatric/Behavioral: Negative.         Objective:  Physical Exam  Constitutional: He is oriented to person, place, and time. He appears well-developed and well-nourished. He is active and cooperative.  Non-toxic appearance. He does not have a sickly appearance. He does not appear ill. No distress.  BP 118/78   Pulse 90   Temp 97.9 F (36.6 C) (Oral)   Resp 16   Ht 6' 4.97" (1.955 m)   Wt 207 lb (93.9 kg)   SpO2 97%   BMI 24.57 kg/m    HENT:  Head: Normocephalic and atraumatic.  Right Ear: Hearing, tympanic membrane, external ear and ear canal normal.  Left Ear: Hearing, tympanic membrane, external ear and ear canal normal.  Nose: Nose normal.  Mouth/Throat: Uvula is midline, oropharynx is clear and moist and mucous membranes are normal. He does not have dentures. No oral lesions. No trismus in the jaw. Normal dentition. No dental abscesses, uvula swelling, lacerations or dental caries.  Eyes: Conjunctivae, EOM and lids are normal. Pupils are equal, round, and reactive to light. Right eye exhibits no discharge. Left eye exhibits no discharge. No scleral icterus.  Fundoscopic exam:      The right eye shows no arteriolar narrowing, no AV nicking, no exudate, no hemorrhage and no papilledema.       The left eye shows no arteriolar narrowing, no AV nicking, no exudate, no hemorrhage and no papilledema.  Neck: Normal range of motion, full passive range of motion without pain and phonation normal. Neck supple. No spinous process tenderness and no muscular tenderness present. No neck rigidity. No tracheal deviation, no edema, no erythema and normal range of motion present. No thyromegaly present.  Cardiovascular: Normal rate, regular rhythm, S1 normal, S2 normal, normal heart sounds, intact distal pulses and normal pulses. Exam reveals no gallop and no friction rub.  No  murmur heard. Pulmonary/Chest: Effort normal and breath sounds normal. No respiratory distress. He has no wheezes. He has no rales.  Abdominal: Soft. Normal appearance and bowel sounds are normal. He exhibits no distension and no mass. There is no hepatosplenomegaly. There is no tenderness. There is no rebound and no guarding. No hernia.  Musculoskeletal: Normal range of motion. He exhibits no edema or tenderness.       Cervical back: Normal. He exhibits normal range of motion, no tenderness, no bony tenderness, no swelling, no edema, no deformity, no laceration, no pain, no spasm and normal pulse.       Thoracic back: Normal.       Lumbar back: Normal.  Lymphadenopathy:       Head (right side): No submental, no submandibular, no tonsillar, no preauricular, no posterior auricular and no occipital adenopathy present.       Head (left side): No submental, no submandibular, no tonsillar, no preauricular, no posterior auricular and no occipital adenopathy  present.    He has no cervical adenopathy.       Right: No supraclavicular adenopathy present.       Left: No supraclavicular adenopathy present.  Neurological: He is alert and oriented to person, place, and time. He has normal strength and normal reflexes. He displays no tremor. No cranial nerve deficit. He exhibits normal muscle tone. Coordination and gait normal.  Skin: Skin is warm, dry and intact. No abrasion, no ecchymosis, no laceration, no lesion and no rash noted. He is not diaphoretic. No cyanosis or erythema. No pallor. Nails show no clubbing.  Psychiatric: He has a normal mood and affect. His speech is normal and behavior is normal. Judgment and thought content normal. Cognition and memory are normal.           Assessment & Plan:   Problem List Items Addressed This Visit    HTN (hypertension)    Controlled. COntinue.      Relevant Medications   EPINEPHrine 0.3 mg/0.3 mL IJ SOAJ injection   propranolol ER (INDERAL LA) 60 MG 24  hr capsule   lisinopril-hydrochlorothiazide (PRINZIDE,ZESTORETIC) 10-12.5 MG tablet   Other Relevant Orders   CBC with Differential/Platelet (Completed)   Comprehensive metabolic panel (Completed)   TSH (Completed)   Inappropriate sinus tachycardia    Update labs. Continue treatment with propranolol. If continues, consider increase dose vs re-evaluation with cardiology.      Relevant Medications   EPINEPHrine 0.3 mg/0.3 mL IJ SOAJ injection   propranolol ER (INDERAL LA) 60 MG 24 hr capsule   lisinopril-hydrochlorothiazide (PRINZIDE,ZESTORETIC) 10-12.5 MG tablet   Mixed hyperlipidemia   Relevant Medications   EPINEPHrine 0.3 mg/0.3 mL IJ SOAJ injection   propranolol ER (INDERAL LA) 60 MG 24 hr capsule   lisinopril-hydrochlorothiazide (PRINZIDE,ZESTORETIC) 10-12.5 MG tablet   Other Relevant Orders   Lipid panel (Completed)    Other Visit Diagnoses    Annual physical exam    -  Primary   Age appropriate health guidance provided.   Nocturia       await labs.   Relevant Orders   PSA (Completed)   Urinalysis, dipstick only (Completed)   Urine Microscopic (Completed)   Need for Tdap vaccination       declined.   Need for influenza vaccination       declined.       Return in about 1 year (around 01/16/2018) for for wellness, sooner if indicated by labs.   Fernande Brashelle S. Nirel Babler, PA-C Primary Care at [facility]

## 2017-01-16 NOTE — Patient Instructions (Addendum)
   IF you received an x-ray today, you will receive an invoice from [facility]. Please contact [facility] at [phone] with questions or concerns regarding your invoice.   IF you received labwork today, you will receive an invoice from [facility]. Please contact [facility] at [phone] with questions or concerns regarding your invoice.   Our billing staff will not be able to assist you with questions regarding bills from these companies.  You will be contacted with the lab results as soon as they are available. The fastest way to get your results is to activate your My Chart account. Instructions are located on the last page of this paperwork. If you have not heard from us regarding the results in 2 weeks, please contact this office.      Preventive Care 40-64 Years, Male Preventive care refers to lifestyle choices and visits with your health care provider that can promote health and wellness. What does preventive care include?  A yearly physical exam. This is also called an annual well check.  Dental exams once or twice a year.  Routine eye exams. Ask your health care provider how often you should have your eyes checked.  Personal lifestyle choices, including: ? Daily care of your teeth and gums. ? Regular physical activity. ? Eating a healthy diet. ? Avoiding tobacco and drug use. ? Limiting alcohol use. ? Practicing safe sex. ? Taking low-dose aspirin every day starting at age 50. What happens during an annual well check? The services and screenings done by your health care provider during your annual well check will depend on your age, overall health, lifestyle risk factors, and family history of disease. Counseling Your health care provider may ask you questions about your:  Alcohol use.  Tobacco use.  Drug use.  Emotional well-being.  Home and relationship well-being.  Sexual activity.  Eating habits.  Work and work  environment.  Screening You may have the following tests or measurements:  Height, weight, and BMI.  Blood pressure.  Lipid and cholesterol levels. These may be checked every 5 years, or more frequently if you are over 50 years old.  Skin check.  Lung cancer screening. You may have this screening every year starting at age 55 if you have a 30-pack-year history of smoking and currently smoke or have quit within the past 15 years.  Fecal occult blood test (FOBT) of the stool. You may have this test every year starting at age 50.  Flexible sigmoidoscopy or colonoscopy. You may have a sigmoidoscopy every 5 years or a colonoscopy every 10 years starting at age 50.  Prostate cancer screening. Recommendations will vary depending on your family history and other risks.  Hepatitis C blood test.  Hepatitis B blood test.  Sexually transmitted disease (STD) testing.  Diabetes screening. This is done by checking your blood sugar (glucose) after you have not eaten for a while (fasting). You may have this done every 1-3 years.  Discuss your test results, treatment options, and if necessary, the need for more tests with your health care provider. Vaccines Your health care provider may recommend certain vaccines, such as:  Influenza vaccine. This is recommended every year.  Tetanus, diphtheria, and acellular pertussis (Tdap, Td) vaccine. You may need a Td booster every 10 years.  Varicella vaccine. You may need this if you have not been vaccinated.  Zoster vaccine. You may need this after age 60.  Measles, mumps, and rubella (MMR) vaccine. You may need at least one dose   of MMR if you were born in 1957 or later. You may also need a second dose.  Pneumococcal 13-valent conjugate (PCV13) vaccine. You may need this if you have certain conditions and have not been vaccinated.  Pneumococcal polysaccharide (PPSV23) vaccine. You may need one or two doses if you smoke cigarettes or if you have  certain conditions.  Meningococcal vaccine. You may need this if you have certain conditions.  Hepatitis A vaccine. You may need this if you have certain conditions or if you travel or work in places where you may be exposed to hepatitis A.  Hepatitis B vaccine. You may need this if you have certain conditions or if you travel or work in places where you may be exposed to hepatitis B.  Haemophilus influenzae type b (Hib) vaccine. You may need this if you have certain risk factors.  Talk to your health care provider about which screenings and vaccines you need and how often you need them. This information is not intended to replace advice given to you by your health care provider. Make sure you discuss any questions you have with your health care provider. Document Released: 02/26/2015 Document Revised: 10/20/2015 Document Reviewed: 12/01/2014 Elsevier Interactive Patient Education  2017 [employer]  

## 2017-01-17 LAB — URINALYSIS, DIPSTICK ONLY
BILIRUBIN UA: NEGATIVE
Glucose, UA: NEGATIVE
LEUKOCYTES UA: NEGATIVE
Nitrite, UA: NEGATIVE
PH UA: 6 (ref 5.0–7.5)
PROTEIN UA: NEGATIVE
RBC UA: NEGATIVE
SPEC GRAV UA: 1.023 (ref 1.005–1.030)
Urobilinogen, Ur: 0.2 mg/dL (ref 0.2–1.0)

## 2017-01-17 LAB — COMPREHENSIVE METABOLIC PANEL
A/G RATIO: 2.1 (ref 1.2–2.2)
ALBUMIN: 4.6 g/dL (ref 3.5–5.5)
ALT: 54 IU/L — ABNORMAL HIGH (ref 0–44)
AST: 37 IU/L (ref 0–40)
Alkaline Phosphatase: 69 IU/L (ref 39–117)
BILIRUBIN TOTAL: 0.5 mg/dL (ref 0.0–1.2)
BUN / CREAT RATIO: 20 (ref 9–20)
BUN: 17 mg/dL (ref 6–24)
CHLORIDE: 97 mmol/L (ref 96–106)
CO2: 22 mmol/L (ref 20–29)
Calcium: 9.6 mg/dL (ref 8.7–10.2)
Creatinine, Ser: 0.84 mg/dL (ref 0.76–1.27)
GFR calc non Af Amer: 104 mL/min/{1.73_m2} (ref >59)
GFR, EST AFRICAN AMERICAN: 121 mL/min/{1.73_m2} (ref >59)
Globulin, Total: 2.2 g/dL (ref 1.5–4.5)
Glucose: 76 mg/dL (ref 65–99)
POTASSIUM: 4.7 mmol/L (ref 3.5–5.2)
Sodium: 137 mmol/L (ref 134–144)
TOTAL PROTEIN: 6.8 g/dL (ref 6.0–8.5)

## 2017-01-17 LAB — CBC WITH DIFFERENTIAL/PLATELET
Basophils Absolute: 0 10*3/uL (ref 0.0–0.2)
Basos: 0 %
EOS (ABSOLUTE): 0.1 10*3/uL (ref 0.0–0.4)
Eos: 2 %
HEMATOCRIT: 44.8 % (ref 37.5–51.0)
HEMOGLOBIN: 15 g/dL (ref 13.0–17.7)
IMMATURE GRANS (ABS): 0 10*3/uL (ref 0.0–0.1)
Immature Granulocytes: 0 %
Lymphocytes Absolute: 1.6 10*3/uL (ref 0.7–3.1)
Lymphs: 20 %
MCH: 29.9 pg (ref 26.6–33.0)
MCHC: 33.5 g/dL (ref 31.5–35.7)
MCV: 89 fL (ref 79–97)
MONOCYTES: 11 %
Monocytes Absolute: 0.9 10*3/uL (ref 0.1–0.9)
NEUTROS ABS: 5.4 10*3/uL (ref 1.4–7.0)
Neutrophils: 67 %
Platelets: 283 10*3/uL (ref 150–379)
RBC: 5.02 x10E6/uL (ref 4.14–5.80)
RDW: 13.3 % (ref 12.3–15.4)
WBC: 8 10*3/uL (ref 3.4–10.8)

## 2017-01-17 LAB — LIPID PANEL
Chol/HDL Ratio: 3.3 ratio (ref 0.0–5.0)
Cholesterol, Total: 219 mg/dL — ABNORMAL HIGH (ref 100–199)
HDL: 67 mg/dL (ref >39)
LDL Calculated: 138 mg/dL — ABNORMAL HIGH (ref 0–99)
Triglycerides: 68 mg/dL (ref 0–149)
VLDL CHOLESTEROL CAL: 14 mg/dL (ref 5–40)

## 2017-01-17 LAB — URINALYSIS, MICROSCOPIC ONLY
Bacteria, UA: NONE SEEN
Casts: NONE SEEN /lpf
Epithelial Cells (non renal): NONE SEEN /hpf (ref 0–10)

## 2017-01-17 LAB — TSH: TSH: 1.77 u[IU]/mL (ref 0.450–4.500)

## 2017-01-17 LAB — PSA: PROSTATE SPECIFIC AG, SERUM: 0.7 ng/mL (ref 0.0–4.0)

## 2017-01-22 DIAGNOSIS — E782 Mixed hyperlipidemia: Secondary | ICD-10-CM | POA: Insufficient documentation

## 2017-01-22 NOTE — Assessment & Plan Note (Signed)
Controlled. COntinue.

## 2017-01-22 NOTE — Assessment & Plan Note (Signed)
Update labs. Continue treatment with propranolol. If continues, consider increase dose vs re-evaluation with cardiology.

## 2017-05-17 ENCOUNTER — Encounter: Payer: Self-pay | Admitting: Physician Assistant

## 2018-01-18 ENCOUNTER — Other Ambulatory Visit: Payer: Self-pay

## 2018-01-18 ENCOUNTER — Ambulatory Visit (INDEPENDENT_AMBULATORY_CARE_PROVIDER_SITE_OTHER): Payer: BLUE CROSS/BLUE SHIELD | Admitting: Emergency Medicine

## 2018-01-18 ENCOUNTER — Encounter: Payer: Self-pay | Admitting: Emergency Medicine

## 2018-01-18 VITALS — BP 116/77 | HR 88 | Temp 97.7°F | Resp 16 | Ht 75.25 in | Wt 199.0 lb

## 2018-01-18 DIAGNOSIS — Z1329 Encounter for screening for other suspected endocrine disorder: Secondary | ICD-10-CM | POA: Diagnosis not present

## 2018-01-18 DIAGNOSIS — Z13228 Encounter for screening for other metabolic disorders: Secondary | ICD-10-CM

## 2018-01-18 DIAGNOSIS — Z0000 Encounter for general adult medical examination without abnormal findings: Secondary | ICD-10-CM | POA: Diagnosis not present

## 2018-01-18 DIAGNOSIS — Z13220 Encounter for screening for lipoid disorders: Secondary | ICD-10-CM | POA: Diagnosis not present

## 2018-01-18 DIAGNOSIS — Z130 Encounter for screening for diseases of the blood and blood-forming organs and certain disorders involving the immune mechanism: Secondary | ICD-10-CM

## 2018-01-18 DIAGNOSIS — R000 Tachycardia, unspecified: Secondary | ICD-10-CM

## 2018-01-18 DIAGNOSIS — I10 Essential (primary) hypertension: Secondary | ICD-10-CM

## 2018-01-18 MED ORDER — PROPRANOLOL HCL ER 60 MG PO CP24: 60.0000 mg | ORAL_CAPSULE | Freq: Every day | ORAL | 3 refills | Status: AC

## 2018-01-18 MED ORDER — LISINOPRIL-HYDROCHLOROTHIAZIDE 10-12.5 MG PO TABS: 1.0000 | ORAL_TABLET | Freq: Every day | ORAL | 3 refills | Status: AC

## 2018-01-18 NOTE — Patient Instructions (Addendum)

## 2018-01-18 NOTE — Progress Notes (Signed)
BP Readings from Last 3 Encounters:  01/18/18 116/77  01/16/17 118/78  01/04/16 116/74   Jesse Stevenson 47 y.o.   Chief Complaint  Patient presents with  . Annual Exam  . Medication Refill    lisinopril-hctz and propranonol    HISTORY OF PRESENT ILLNESS: This is a 47 y.o. male here for annual exam. Has history of hypertension on Zestoretic and Inderal LA.  Doing well. Has no complaints today or any medical concerns.  HPI   Prior to Admission medications   Medication Sig Start Date End Date Taking? Authorizing Provider  carisoprodol (SOMA) 350 MG tablet TAKE ONE TABLET BY MOUTH ONCE TO TWICE DAILY AS NEEDED FOR MUSCLE SPASM 05/05/14  Yes Maurice MarchMcPherson, Barbara B, MD  diazepam (VALIUM) 10 MG tablet  03/22/15  Yes [provider]  EPINEPHrine 0.3 mg/0.3 mL IJ SOAJ injection Inject into the muscle. 06/26/14  Yes [provider]  HYDROcodone-acetaminophen (NORCO) 7.5-325 MG tablet Take 1 tablet by mouth every 6 (six) hours as needed for moderate pain.   Yes [provider]  propranolol ER (INDERAL LA) 60 MG 24 hr capsule Take 1 capsule (60 mg total) by mouth daily. Pt needs office visit for more refills. 01/16/17  Yes Porfirio OarJeffery, Chelle, PA  zolpidem (AMBIEN) 10 MG tablet  03/23/15  Yes [provider]  lisinopril-hydrochlorothiazide (PRINZIDE,ZESTORETIC) 10-12.5 MG tablet Take 1 tablet by mouth daily. Needs an appt before it can be refilled again. 01/16/17 02/15/17  Porfirio OarJeffery, Chelle, PA    Allergies  Allergen Reactions  . Etodolac Nausea And Vomiting    Pt states, "that medication made me sick".  . Aspirin Rash    Patient Active Problem List   Diagnosis Date Noted  . Mixed hyperlipidemia 01/22/2017  . Inappropriate sinus tachycardia 11/05/2014  . Scoliosis of thoracic spine 06/20/2011  . Mucocutaneous candidiasis 10/31/2010  . HTN (hypertension) 10/31/2010    Past Medical History:  Diagnosis Date  . Anxiety   . HTN (hypertension)   . Mitral  valve prolapse     Past Surgical History:  Procedure Laterality Date  . HERNIA REPAIR    . TONSILLECTOMY AND ADENOIDECTOMY  02/13/1978    Social History   Socioeconomic History  . Marital status: Married    Spouse name: Not on file  . Number of children: 2  . Years of education: Master's degree  . Highest education level: Not on file  Occupational History  . Occupation: Training and development officerresident of Company    Employer: [employer]    CommentIT consultant: biologist  Social Needs  . Financial resource strain: Not on file  . Food insecurity:    Worry: Not on file    Inability: Not on file  . Transportation needs:    Medical: Not on file    Non-medical: Not on file  Tobacco Use  . Smoking status: Never Smoker  . Smokeless tobacco: Never Used  Substance and Sexual Activity  . Alcohol use: Yes    Alcohol/week: 3.0 - 5.0 standard drinks    Types: 3 - 5 Glasses of wine per week  . Drug use: No  . Sexual activity: Not on file  Lifestyle  . Physical activity:    Days per week: Not on file    Minutes per session: Not on file  . Stress: Not on file  Relationships  . Social connections:    Talks on phone: Not on file    Gets together: Not on file    Attends religious service: Not on file  Active member of club or organization: Not on file    Attends meetings of clubs or organizations: Not on file    Relationship status: Not on file  . Intimate partner violence:    Fear of current or ex partner: Not on file    Emotionally abused: Not on file    Physically abused: Not on file    Forced sexual activity: Not on file  Other Topics Concern  . Not on file  Social History Narrative   Lives at home with wife and son. Daughter is in college.    Education: Regulatory affairs officer.    Exercise: No.    Family History  Problem Relation Age of Onset  . Hypertension Mother   . Hyperlipidemia Mother   . Heart disease Maternal Grandfather   . Mental illness Daughter        bipolar     Review of Systems    Constitutional: Negative.  Negative for chills, fever and weight loss.  HENT: Negative.  Negative for hearing loss and sore throat.   Eyes: Negative.  Negative for blurred vision and double vision.  Respiratory: Negative.  Negative for cough and shortness of breath.   Cardiovascular: Negative.  Negative for chest pain and palpitations.  Gastrointestinal: Negative.  Negative for abdominal pain, diarrhea, nausea and vomiting.  Genitourinary: Negative.  Negative for dysuria and urgency.  Musculoskeletal: Negative.  Negative for back pain, myalgias and neck pain.  Skin: Negative.  Negative for rash.  Neurological: Negative.  Negative for dizziness and headaches.  Endo/Heme/Allergies: Negative.   All other systems reviewed and are negative.    Physical Exam  Constitutional: He is oriented to person, place, and time. He appears well-developed and well-nourished.  Marfanoid features  HENT:  Head: Normocephalic and atraumatic.  Right Ear: External ear normal.  Left Ear: External ear normal.  Nose: Nose normal.  Mouth/Throat: Oropharynx is clear and moist.  Eyes: Pupils are equal, round, and reactive to light. Conjunctivae and EOM are normal.  Neck: Normal range of motion. Neck supple. No thyromegaly present.  Cardiovascular: Normal rate, regular rhythm and normal heart sounds.  Pulmonary/Chest: Effort normal and breath sounds normal.  Abdominal: Soft. He exhibits no distension and no mass. There is no tenderness.  Musculoskeletal: Normal range of motion. He exhibits no edema or tenderness.  Positive wrist sign  Lymphadenopathy:    He has no cervical adenopathy.  Neurological: He is oriented to person, place, and time. No sensory deficit. He exhibits normal muscle tone. Coordination normal.  Skin: Skin is warm and dry. Capillary refill takes less than 2 seconds.  Psychiatric: He has a normal mood and affect. His behavior is normal.  Vitals reviewed.    ASSESSMENT & PLAN: Jesse Stevenson  was seen today for annual exam and medication refill.  Diagnoses and all orders for this visit:  Routine general medical examination at a health care facility  Essential hypertension -     Comprehensive metabolic panel -     propranolol ER (INDERAL LA) 60 MG 24 hr capsule; Take 1 capsule (60 mg total) by mouth daily. -     lisinopril-hydrochlorothiazide (PRINZIDE,ZESTORETIC) 10-12.5 MG tablet; Take 1 tablet by mouth daily.  Inappropriate sinus tachycardia -     propranolol ER (INDERAL LA) 60 MG 24 hr capsule; Take 1 capsule (60 mg total) by mouth daily.  Screening for deficiency anemia -     CBC with Differential/Platelet  Screening for lipoid disorders -     Lipid panel  Screening for endocrine, metabolic and immunity disorder -     Comprehensive metabolic panel -     Lipid panel -     TSH    Patient Instructions       If you have lab work done today you will be contacted with your lab results within the next 2 weeks.  If you have not heard from [place] then please contact us. The fastest way to get your results is to register for My Chart.   IF you received an x-ray today, you will receive an invoice from [facility]. Please contact [facility] at [phone] with questions or concerns regarding your invoice.   IF you received labwork today, you will receive an invoice from [facility]. Please contact [facility] at [phone] with questions or concerns regarding your invoice.   Our billing staff will not be able to assist you with questions regarding bills from these companies.  You will be contacted with the lab results as soon as they are available. The fastest way to get your results is to activate your My Chart account. Instructions are located on the last page of this paperwork. If you have not heard from [place] regarding the results in 2 weeks, please contact this office.      Health Maintenance, Male A healthy lifestyle and preventive care is important  for your health and wellness. Ask your health care provider about what schedule of regular examinations is right for you. What should I know about weight and diet? Eat a Healthy Diet  Eat plenty of vegetables, fruits, whole grains, low-fat dairy products, and lean protein.  Do not eat a lot of foods high in solid fats, added sugars, or salt.  Maintain a Healthy Weight Regular exercise can help you achieve or maintain a healthy weight. You should:  Do at least 150 minutes of exercise each week. The exercise should increase your heart rate and make you sweat (moderate-intensity exercise).  Do strength-training exercises at least twice a week.  Watch Your Levels of Cholesterol and Blood Lipids  Have your blood tested for lipids and cholesterol every 5 years starting at 48 years of age. If you are at high risk for heart disease, you should start having your blood tested when you are 48 years old. You may need to have your cholesterol levels checked more often if: ? Your lipid or cholesterol levels are high. ? You are older than 48 years of age. ? You are at high risk for heart disease.  What should I know about cancer screening? Many types of cancers can be detected early and may often be prevented. Lung Cancer  You should be screened every year for lung cancer if: ? You are a current smoker who has smoked for at least 30 years. ? You are a former smoker who has quit within the past 15 years.  Talk to your health care provider about your screening options, when you should start screening, and how often you should be screened.  Colorectal Cancer  Routine colorectal cancer screening usually begins at 48 years of age and should be repeated every 5-10 years until you are 48 years old. You may need to be screened more often if early forms of precancerous polyps or small growths are found. Your health care provider may recommend screening at an earlier age if you have risk factors for colon  cancer.  Your health care provider may recommend using home test kits to check for hidden blood in the  stool.  A small camera at the end of a tube can be used to examine your colon (sigmoidoscopy or colonoscopy). This checks for the earliest forms of colorectal cancer.  Prostate and Testicular Cancer  Depending on your age and overall health, your health care provider may do certain tests to screen for prostate and testicular cancer.  Talk to your health care provider about any symptoms or concerns you have about testicular or prostate cancer.  Skin Cancer  Check your skin from head to toe regularly.  Tell your health care provider about any new moles or changes in moles, especially if: ? There is a change in a mole's size, shape, or color. ? You have a mole that is larger than a pencil eraser.  Always use sunscreen. Apply sunscreen liberally and repeat throughout the day.  Protect yourself by wearing long sleeves, pants, a wide-brimmed hat, and sunglasses when outside.  What should I know about heart disease, diabetes, and high blood pressure?  If you are 37-107 years of age, have your blood pressure checked every 3-5 years. If you are 108 years of age or older, have your blood pressure checked every year. You should have your blood pressure measured twice-once when you are at a hospital or clinic, and once when you are not at a hospital or clinic. Record the average of the two measurements. To check your blood pressure when you are not at a hospital or clinic, you can use: ? An automated blood pressure machine at a pharmacy. ? A home blood pressure monitor.  Talk to your health care provider about your target blood pressure.  If you are between 31-16 years old, ask your health care provider if you should take aspirin to prevent heart disease.  Have regular diabetes screenings by checking your fasting blood sugar level. ? If you are at a normal weight and have a low risk for  diabetes, have this test once every three years after the age of 55. ? If you are overweight and have a high risk for diabetes, consider being tested at a younger age or more often.  A one-time screening for abdominal aortic aneurysm (AAA) by ultrasound is recommended for men aged 65-75 years who are current or former smokers. What should I know about preventing infection? Hepatitis B If you have a higher risk for hepatitis B, you should be screened for this virus. Talk with your health care provider to find out if you are at risk for hepatitis B infection. Hepatitis C Blood testing is recommended for:  Everyone born from [birth year] through 1966.  Anyone with known risk factors for hepatitis C.  Sexually Transmitted Diseases (STDs)  You should be screened each year for STDs including gonorrhea and chlamydia if: ? You are sexually active and are younger than 48 years of age. ? You are older than 48 years of age and your health care provider tells you that you are at risk for this type of infection. ? Your sexual activity has changed since you were last screened and you are at an increased risk for chlamydia or gonorrhea. Ask your health care provider if you are at risk.  Talk with your health care provider about whether you are at high risk of being infected with HIV. Your health care provider may recommend a prescription medicine to help prevent HIV infection.  What else can I do?  Schedule regular health, dental, and eye exams.  Stay current with your vaccines (immunizations).  Do not  use any tobacco products, such as cigarettes, chewing tobacco, and e-cigarettes. If you need help quitting, ask your health care provider.  Limit alcohol intake to no more than 2 drinks per day. One drink equals 12 ounces of beer, 5 ounces of wine, or 1 ounces of hard liquor.  Do not use street drugs.  Do not share needles.  Ask your health care provider for help if you need support or information about  quitting drugs.  Tell your health care provider if you often feel depressed.  Tell your health care provider if you have ever been abused or do not feel safe at home. This information is not intended to replace advice given to you by your health care provider. Make sure you discuss any questions you have with your health care provider. Document Released: 07/29/2007 Document Revised: 09/29/2015 Document Reviewed: 11/03/2014 Elsevier Interactive Patient Education  2018 Elsevier Inc.      Edwina Barth, MD [facility]

## 2018-01-19 LAB — CBC WITH DIFFERENTIAL/PLATELET
Basophils Absolute: 0.1 10*3/uL (ref 0.0–0.2)
Basos: 1 %
EOS (ABSOLUTE): 0.1 10*3/uL (ref 0.0–0.4)
EOS: 1 %
HEMATOCRIT: 44.3 % (ref 37.5–51.0)
HEMOGLOBIN: 15.3 g/dL (ref 13.0–17.7)
Immature Grans (Abs): 0 10*3/uL (ref 0.0–0.1)
Immature Granulocytes: 0 %
LYMPHS ABS: 1.3 10*3/uL (ref 0.7–3.1)
Lymphs: 16 %
MCH: 30.1 pg (ref 26.6–33.0)
MCHC: 34.5 g/dL (ref 31.5–35.7)
MCV: 87 fL (ref 79–97)
MONOCYTES: 10 %
MONOS ABS: 0.9 10*3/uL (ref 0.1–0.9)
NEUTROS ABS: 5.9 10*3/uL (ref 1.4–7.0)
Neutrophils: 72 %
Platelets: 284 10*3/uL (ref 150–450)
RBC: 5.09 x10E6/uL (ref 4.14–5.80)
RDW: 12.1 % — ABNORMAL LOW (ref 12.3–15.4)
WBC: 8.3 10*3/uL (ref 3.4–10.8)

## 2018-01-19 LAB — COMPREHENSIVE METABOLIC PANEL
ALBUMIN: 4.7 g/dL (ref 3.5–5.5)
ALT: 53 IU/L — ABNORMAL HIGH (ref 0–44)
AST: 31 IU/L (ref 0–40)
Albumin/Globulin Ratio: 2.4 — ABNORMAL HIGH (ref 1.2–2.2)
Alkaline Phosphatase: 58 IU/L (ref 39–117)
BILIRUBIN TOTAL: 0.6 mg/dL (ref 0.0–1.2)
BUN / CREAT RATIO: 13 (ref 9–20)
BUN: 13 mg/dL (ref 6–24)
CO2: 21 mmol/L (ref 20–29)
CREATININE: 0.97 mg/dL (ref 0.76–1.27)
Calcium: 9.7 mg/dL (ref 8.7–10.2)
Chloride: 99 mmol/L (ref 96–106)
GFR calc non Af Amer: 92 mL/min/{1.73_m2} (ref >59)
GFR, EST AFRICAN AMERICAN: 106 mL/min/{1.73_m2} (ref >59)
GLOBULIN, TOTAL: 2 g/dL (ref 1.5–4.5)
Glucose: 95 mg/dL (ref 65–99)
Potassium: 4.7 mmol/L (ref 3.5–5.2)
SODIUM: 140 mmol/L (ref 134–144)
TOTAL PROTEIN: 6.7 g/dL (ref 6.0–8.5)

## 2018-01-19 LAB — LIPID PANEL
CHOL/HDL RATIO: 3.1 ratio (ref 0.0–5.0)
Cholesterol, Total: 238 mg/dL — ABNORMAL HIGH (ref 100–199)
HDL: 78 mg/dL (ref >39)
LDL CALC: 147 mg/dL — AB (ref 0–99)
Triglycerides: 67 mg/dL (ref 0–149)
VLDL Cholesterol Cal: 13 mg/dL (ref 5–40)

## 2018-01-19 LAB — TSH: TSH: 1.5 u[IU]/mL (ref 0.450–4.500)

## 2018-01-22 ENCOUNTER — Encounter: Payer: Self-pay | Admitting: *Deleted

## 2018-01-23 ENCOUNTER — Other Ambulatory Visit: Payer: Self-pay | Admitting: Psychiatry

## 2018-01-25 ENCOUNTER — Other Ambulatory Visit: Payer: Self-pay

## 2018-01-28 ENCOUNTER — Other Ambulatory Visit: Payer: Self-pay | Admitting: Psychiatry

## 2018-01-28 NOTE — Telephone Encounter (Signed)
Need to review paper chart  

## 2018-02-27 ENCOUNTER — Other Ambulatory Visit: Payer: Self-pay | Admitting: Psychiatry

## 2018-02-27 NOTE — Telephone Encounter (Signed)
Not seen in epic need to review chart and check last ov

## 2018-07-31 ENCOUNTER — Encounter: Payer: Self-pay | Admitting: Psychiatry

## 2018-07-31 ENCOUNTER — Other Ambulatory Visit: Payer: Self-pay

## 2018-07-31 ENCOUNTER — Ambulatory Visit (INDEPENDENT_AMBULATORY_CARE_PROVIDER_SITE_OTHER): Payer: BC Managed Care – PPO | Admitting: Psychiatry

## 2018-07-31 DIAGNOSIS — F5105 Insomnia due to other mental disorder: Secondary | ICD-10-CM | POA: Diagnosis not present

## 2018-07-31 DIAGNOSIS — F4010 Social phobia, unspecified: Secondary | ICD-10-CM | POA: Diagnosis not present

## 2018-07-31 MED ORDER — VALIUM 10 MG PO TABS: 10.0000 mg | ORAL_TABLET | Freq: Two times a day (BID) | ORAL | 1 refills | Status: DC

## 2018-07-31 MED ORDER — ZOLPIDEM TARTRATE ER 12.5 MG PO TBCR: 12.5000 mg | EXTENDED_RELEASE_TABLET | Freq: Every day | ORAL | 1 refills | Status: DC

## 2018-07-31 NOTE — Progress Notes (Signed)
Jesse Stevenson [id number] 1970/11/13 48 y.o.  Subjective:   Patient ID:  Jesse Stevenson is a 48 y.o. (DOB 1970/11/13) male.  Chief Complaint:  Chief Complaint  Patient presents with  . Follow-up    Medication Management  . Medication Refill    Ambien and Valium    HPI Jesse Stevenson presents to the office today for follow-up of social anxiety and insomnia.  Last seen in June 2019.  No meds were changed.  Pretty well.  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  Patient reports stable mood and denies depressed or irritable moods.  Patient stable with anxiety.  No sig avoidance.  Patient denies difficulty with sleep initiation or maintenancewith Ambien 8 hours. Denies appetite disturbance.  Patient reports that energy and motivation have been good.  Patient denies any difficulty with concentration.  Patient denies any suicidal ideation.  Past Psychiatric Medication Trials:  Brand Valium, Ambien, Ambien CR, On diazepam since college. Lexapro, sertraline, Seroquel, Wellbutrin, propranolol, Depakote  Review of Systems:  Review of Systems  Neurological: Negative for tremors and weakness.    Medications: I have reviewed the patient's current medications.  Current Outpatient Medications  Medication Sig Dispense Refill  . carisoprodol (SOMA) 350 MG tablet TAKE ONE TABLET BY MOUTH ONCE TO TWICE DAILY AS NEEDED FOR MUSCLE SPASM 60 tablet 5  . EPINEPHrine 0.3 mg/0.3 mL IJ SOAJ injection Inject into the muscle.    Marland Kitchen. HYDROcodone-acetaminophen (NORCO) 7.5-325 MG tablet Take 1 tablet by mouth every 6 (six) hours as needed for moderate pain.    Marland Kitchen. lisinopril-hydrochlorothiazide (PRINZIDE,ZESTORETIC) 10-12.5 MG tablet Take 1 tablet by mouth daily. 90 tablet 3  . propranolol ER (INDERAL LA) 60 MG 24 hr capsule Take 1 capsule (60 mg total) by mouth daily. 90 capsule 3  . VALIUM 10 MG tablet TAKE 1 TABLET BY MOUTH TWICE DAILY 180 tablet 1  . zolpidem (AMBIEN CR)  12.5 MG CR tablet TAKE 1 TABLET BY MOUTH ONCE DAILY AT BEDTIME 90 tablet 1   No current facility-administered medications for this visit.     Medication Side Effects: None  Allergies:  Allergies  Allergen Reactions  . Etodolac Nausea And Vomiting    Pt states, "that medication made me sick".  . Aspirin Rash    Past Medical History:  Diagnosis Date  . Anxiety   . HTN (hypertension)   . Mitral valve prolapse     Family History  Problem Relation Age of Onset  . Hypertension Mother   . Hyperlipidemia Mother   . Heart disease Maternal Grandfather   . Mental illness Daughter        bipolar    Social History   Socioeconomic History  . Marital status: Married    Spouse name: Not on file  . Number of children: 2  . Years of education: Master's degree  . Highest education level: Not on file  Occupational History  . Occupation: Training and development officerresident of Company    Employer: [employer]    CommentIT consultant: biologist  Social Needs  . Financial resource strain: Not on file  . Food insecurity    Worry: Not on file    Inability: Not on file  . Transportation needs    Medical: Not on file    Non-medical: Not on file  Tobacco Use  . Smoking status: Never Smoker  . Smokeless tobacco: Never Used  Substance and Sexual Activity  . Alcohol use: Yes    Alcohol/week: 3.0 - 5.0  standard drinks    Types: 3 - 5 Glasses of wine per week  . Drug use: No  . Sexual activity: Not on file  Lifestyle  . Physical activity    Days per week: Not on file    Minutes per session: Not on file  . Stress: Not on file  Relationships  . Social Herbalist on phone: Not on file    Gets together: Not on file    Attends religious service: Not on file    Active member of club or organization: Not on file    Attends meetings of clubs or organizations: Not on file    Relationship status: Not on file  . Intimate partner violence    Fear of current or ex partner: Not on file    Emotionally abused: Not on file     Physically abused: Not on file    Forced sexual activity: Not on file  Other Topics Concern  . Not on file  Social History Narrative   Lives at home with wife and son. Daughter is in college.    Education: Museum/gallery curator.    Exercise: No.    Past Medical History, Surgical history, Social history, and Family history were reviewed and updated as appropriate.   Please see review of systems for further details on the patient's review from today.   Objective:   Physical Exam:  There were no vitals taken for this visit.  Physical Exam Constitutional:      General: He is not in acute distress.    Appearance: He is well-developed.  Musculoskeletal:        General: No deformity.  Neurological:     Mental Status: He is alert and oriented to person, place, and time.     Coordination: Coordination normal.  Psychiatric:        Attention and Perception: Attention normal. He is attentive.        Mood and Affect: Mood is anxious. Mood is not depressed. Affect is not labile, angry or inappropriate.        Speech: Speech normal.        Behavior: Behavior normal.        Thought Content: Thought content normal. Thought content does not include homicidal or suicidal ideation. Thought content does not include homicidal or suicidal plan.        Cognition and Memory: Cognition normal.        Judgment: Judgment normal.     Comments: Insight is good. Consitricted affect chronically     Lab Review:     Component Value Date/Time   NA 140 01/18/2018 1514   K 4.7 01/18/2018 1514   CL 99 01/18/2018 1514   CO2 21 01/18/2018 1514   GLUCOSE 95 01/18/2018 1514   GLUCOSE 85 01/04/2016 1414   BUN 13 01/18/2018 1514   CREATININE 0.97 01/18/2018 1514   CREATININE 0.84 01/04/2016 1414   CALCIUM 9.7 01/18/2018 1514   PROT 6.7 01/18/2018 1514   ALBUMIN 4.7 01/18/2018 1514   AST 31 01/18/2018 1514   ALT 53 (H) 01/18/2018 1514   ALKPHOS 58 01/18/2018 1514   BILITOT 0.6 01/18/2018 1514   GFRNONAA  92 01/18/2018 1514   GFRNONAA >60 10/31/2010 1455   GFRAA 106 01/18/2018 1514   GFRAA >60 10/31/2010 1455       Component Value Date/Time   WBC 8.3 01/18/2018 1514   WBC 6.6 01/04/2016 1414   RBC 5.09 01/18/2018 1514   RBC  5.40 01/04/2016 1414   HGB 15.3 01/18/2018 1514   HCT 44.3 01/18/2018 1514   PLT 284 01/18/2018 1514   MCV 87 01/18/2018 1514   MCH 30.1 01/18/2018 1514   MCH 29.4 01/04/2016 1414   MCHC 34.5 01/18/2018 1514   MCHC 33.6 01/04/2016 1414   RDW 12.1 (L) 01/18/2018 1514   LYMPHSABS 1.3 01/18/2018 1514   MONOABS 660 01/04/2016 1414   EOSABS 0.1 01/18/2018 1514   BASOSABS 0.1 01/18/2018 1514    No results found for: POCLITH, LITHIUM   No results found for: PHENYTOIN, PHENOBARB, VALPROATE, CBMZ   .res Assessment: Plan:    Jesse Stevenson was seen today for follow-up and medication refill.  Diagnoses and all orders for this visit:  Social anxiety disorder  Insomnia due to mental condition  Other orders -     VALIUM 10 MG tablet; Take 1 tablet (10 mg total) by mouth 2 (two) times daily. -     zolpidem (AMBIEN CR) 12.5 MG CR tablet; Take 1 tablet (12.5 mg total) by mouth at bedtime.     No med changes per his request.  We discussed the short-term risks associated with benzodiazepines including sedation and increased fall risk among others.  Discussed long-term side effect risk including dependence, potential withdrawal symptoms, and the potential eventual dose-related risk of dementia.  No evidence of abuse.  Because he is stable for several years on the same medications, follow-up 1 year.  Meredith Staggersarey Cottle, MD, DFAPA   Please see After Visit Summary for patient specific instructions.  No future appointments.  No orders of the defined types were placed in this encounter.   -------------------------------

## 2018-10-25 ENCOUNTER — Encounter (INDEPENDENT_AMBULATORY_CARE_PROVIDER_SITE_OTHER): Payer: Self-pay

## 2018-10-25 ENCOUNTER — Telehealth: Payer: BLUE CROSS/BLUE SHIELD | Admitting: Nurse Practitioner

## 2018-10-25 ENCOUNTER — Encounter: Payer: Self-pay | Admitting: Nurse Practitioner

## 2018-10-25 DIAGNOSIS — Z20822 Contact with and (suspected) exposure to covid-19: Secondary | ICD-10-CM

## 2018-10-25 NOTE — Progress Notes (Signed)
E-Visit for Corona Virus Screening   Your current symptoms could be consistent with the coronavirus.  Many health care providers can now test patients at their office but not all are.  [facility] has multiple testing sites. For information on our COVID testing locations and hours go to [email or web address]  Please quarantine yourself while awaiting your test results.  We are enrolling you in our MyChart Home Montioring for COVID19 . Daily you will receive a questionnaire within the MyChart website. Our COVID 19 response team willl be monitoriing your responses daily.    COVID-19 is a respiratory illness with symptoms that are similar to the flu. Symptoms are typically mild to moderate, but there have been cases of severe illness and death due to the virus. The following symptoms may appear 2-14 days after exposure: . Fever . Cough . Shortness of breath or difficulty breathing . Chills . Repeated shaking with chills . Muscle pain . Headache . Sore throat . New loss of taste or smell . Fatigue . Congestion or runny nose . Nausea or vomiting . Diarrhea  It is vitally important that if you feel that you have an infection such as this virus or any other virus that you stay home and away from places where you may spread it to others.  You should self-quarantine for 14 days if you have symptoms that could potentially be coronavirus or have been in close contact a with a person diagnosed with COVID-19 within the last 2 weeks. You should avoid contact with people age 65 and older.   You should wear a mask or cloth face covering over your nose and mouth if you must be around other people or animals, including pets (even at home). Try to stay at least 6 feet away from other people. This will protect the people around you.  You may also take acetaminophen (Tylenol) as needed for fever.   Reduce your risk of any infection by using the same precautions used for avoiding the  common cold or flu:  . Wash your hands often with soap and warm water for at least 20 seconds.  If soap and water are not readily available, use an alcohol-based hand sanitizer with at least 60% alcohol.  . If coughing or sneezing, cover your mouth and nose by coughing or sneezing into the elbow areas of your shirt or coat, into a tissue or into your sleeve (not your hands). . Avoid shaking hands with others and consider head nods or verbal greetings only. . Avoid touching your eyes, nose, or mouth with unwashed hands.  . Avoid close contact with people who are sick. . Avoid places or events with large numbers of people in one location, like concerts or sporting events. . Carefully consider travel plans you have or are making. . If you are planning any travel outside or inside the [place], visit the CDC's Travelers' Health webpage for the latest health notices. . If you have some symptoms but not all symptoms, continue to monitor at home and seek medical attention if your symptoms worsen. . If you are having a medical emergency, call 911.  HOME CARE . Only take medications as instructed by your medical team. . Drink plenty of fluids and get plenty of rest. . A steam or ultrasonic humidifier can help if you have congestion.   GET HELP RIGHT AWAY IF YOU HAVE EMERGENCY WARNING SIGNS** FOR COVID-19. If you or someone is showing any of these signs seek emergency medical care immediately. Call   911 or proceed to your closest emergency facility if: . You develop worsening high fever. . Trouble breathing . Bluish lips or face . Persistent pain or pressure in the chest . New confusion . Inability to wake or stay awake . You cough up blood. . Your symptoms become more severe  **This list is not all possible symptoms. Contact your medical provider for any symptoms that are sever or concerning to you.   MAKE SURE YOU   Understand these instructions.  Will watch your condition.  Will get help right  away if you are not doing well or get worse.  Your e-visit answers were reviewed by a board certified advanced clinical practitioner to complete your personal care plan.  Depending on the condition, your plan could have included both over the counter or prescription medications.  If there is a problem please reply once you have received a response from your provider.  Your safety is important to [place].  If you have drug allergies check your prescription carefully.    You can use MyChart to ask questions about today's visit, request a non-urgent call back, or ask for a work or school excuse for 24 hours related to this e-Visit. If it has been greater than 24 hours you will need to follow up with your provider, or enter a new e-Visit to address those concerns. You will get an e-mail in the next two days asking about your experience.  I hope that your e-visit has been valuable and will speed your recovery. Thank you for using e-visits.   Approximately 5 minutes was spent reviewing and documenting in the patient's chart.

## 2018-10-26 ENCOUNTER — Encounter (INDEPENDENT_AMBULATORY_CARE_PROVIDER_SITE_OTHER): Payer: Self-pay

## 2018-10-27 ENCOUNTER — Encounter (INDEPENDENT_AMBULATORY_CARE_PROVIDER_SITE_OTHER): Payer: Self-pay

## 2018-10-28 ENCOUNTER — Telehealth: Payer: Self-pay | Admitting: *Deleted

## 2018-10-28 ENCOUNTER — Encounter (INDEPENDENT_AMBULATORY_CARE_PROVIDER_SITE_OTHER): Payer: Self-pay

## 2018-10-28 NOTE — Telephone Encounter (Signed)
Patient states that he has been running an intermittent low grade fever- highest reading 100 degrees for a few days.  Today reading is 98.0, he also has had sore throat, and reports 2-3 episodes of diarrhea in the past 24 hours which is new.  He was tested for COVID 19 this past weekend at [facility], and his results were negative.  Advised patient to drink plenty of fluids to stay well hydrated.  Advised he can take tylenol and ibuprofen for fever and sore throat as needed, and throat lozenges for sore throat.  Monitor symptoms and call back if symptoms not improving or worsening in the next 2-3 days.  Patient expressed understanding and is agreeable with plan.

## 2018-10-28 NOTE — Telephone Encounter (Signed)
Jesse Stevenson "[facility]" - Questionnaire Submission More Detail >>  Questionnaire [place]  MRN: [id number] DOB: 1970-02-20  Pt Work: [phone] Pt Home: [phone]  Entered: [phone]    Sent: Molli Knock October 28, 2018 4:28 PM  Current view: Showing all answers Show Only Relevant Answers  Legend:     Triggered a BPA Scoring question        Patient Responses  [facility] Mychart Covid-19 Condition Monitoring  Question 10/28/2018 4:28 PM EDT  Are you feeling short of breath today? No  Are you having a cough today? No  Are you experiencing weakness today? No  Are you vomiting? No  How is your appetite compared to yesterday? Same  Are you experiencing diarrhea?  Yes  Is the diarrhea new today, better, the same, or worse than yesterday?

## 2018-10-29 ENCOUNTER — Encounter (INDEPENDENT_AMBULATORY_CARE_PROVIDER_SITE_OTHER): Payer: Self-pay

## 2018-10-29 ENCOUNTER — Telehealth: Payer: Self-pay | Admitting: Emergency Medicine

## 2018-10-30 ENCOUNTER — Encounter (INDEPENDENT_AMBULATORY_CARE_PROVIDER_SITE_OTHER): Payer: Self-pay

## 2018-11-03 ENCOUNTER — Encounter (INDEPENDENT_AMBULATORY_CARE_PROVIDER_SITE_OTHER): Payer: Self-pay

## 2019-01-12 ENCOUNTER — Other Ambulatory Visit: Payer: Self-pay | Admitting: Psychiatry

## 2019-01-12 NOTE — Telephone Encounter (Signed)
Next apt 06/01 

## 2019-02-01 ENCOUNTER — Other Ambulatory Visit: Payer: Self-pay | Admitting: Psychiatry

## 2019-02-02 NOTE — Telephone Encounter (Signed)
Next apt 07/2019 

## 2019-07-15 ENCOUNTER — Other Ambulatory Visit: Payer: Self-pay

## 2019-07-15 ENCOUNTER — Encounter: Payer: Self-pay | Admitting: Psychiatry

## 2019-07-15 ENCOUNTER — Ambulatory Visit (INDEPENDENT_AMBULATORY_CARE_PROVIDER_SITE_OTHER): Payer: BC Managed Care – PPO | Admitting: Psychiatry

## 2019-07-15 DIAGNOSIS — F4010 Social phobia, unspecified: Secondary | ICD-10-CM | POA: Diagnosis not present

## 2019-07-15 DIAGNOSIS — F5105 Insomnia due to other mental disorder: Secondary | ICD-10-CM

## 2019-07-15 MED ORDER — VALIUM 10 MG PO TABS: 10.0000 mg | ORAL_TABLET | Freq: Two times a day (BID) | ORAL | 1 refills | Status: DC

## 2019-07-15 MED ORDER — ZOLPIDEM TARTRATE ER 12.5 MG PO TBCR: 12.5000 mg | EXTENDED_RELEASE_TABLET | Freq: Every day | ORAL | 1 refills | Status: DC

## 2019-07-15 NOTE — Progress Notes (Signed)
[name] [id number] [birth year] 49 y.o.  Subjective:   Patient ID:  Jesse Stevenson is a 49 y.o. (DOB 06/12/1970) male.  Chief Complaint:  Chief Complaint  Patient presents with  . Follow-up    HPI JORDANI NUNN presents to the office today for follow-up of social anxiety and insomnia.  Last seen in June 2020.  No meds were changed.  Takes diazepam 10 BID regularly and Ambien CR 12.5 mg HS and propranolol ER 60 daily.  No SE with meds.  Pretty well.  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  Patient reports stable mood and denies depressed or irritable moods.  Patient stable with anxiety.  No sig avoidance.  Patient denies difficulty with sleep initiation or maintenancewith Ambien 8 hours. Denies appetite disturbance.  Patient reports that energy and motivation have been good.  Patient denies any difficulty with concentration.  Patient denies any suicidal ideation.  Past Psychiatric Medication Trials:  Brand Valium, Ambien, Ambien CR, On diazepam since college. Lexapro, sertraline, Seroquel, Wellbutrin, propranolol, Depakote  Review of Systems:  Review of Systems  Musculoskeletal: Positive for arthralgias.  Neurological: Negative for dizziness, tremors and weakness.    Medications: I have reviewed the patient's current medications.  Current Outpatient Medications  Medication Sig Dispense Refill  . carisoprodol (SOMA) 350 MG tablet TAKE ONE TABLET BY MOUTH ONCE TO TWICE DAILY AS NEEDED FOR MUSCLE SPASM 60 tablet 5  . EPINEPHrine 0.3 mg/0.3 mL IJ SOAJ injection Inject into the muscle.    Marland Kitchen HYDROcodone-acetaminophen (NORCO) 7.5-325 MG tablet Take 1 tablet by mouth every 6 (six) hours as needed for moderate pain.    Marland Kitchen lisinopril-hydrochlorothiazide (PRINZIDE,ZESTORETIC) 10-12.5 MG tablet Take 1 tablet by mouth daily. 90 tablet 3  . propranolol ER (INDERAL LA) 60 MG 24 hr capsule Take 1 capsule (60 mg total) by mouth daily. 90 capsule 3  .  VALIUM 10 MG tablet Take 1 tablet (10 mg total) by mouth 2 (two) times daily. 180 tablet 1  . zolpidem (AMBIEN CR) 12.5 MG CR tablet Take 1 tablet (12.5 mg total) by mouth at bedtime. 90 tablet 1   No current facility-administered medications for this visit.    Medication Side Effects: None  Allergies:  Allergies  Allergen Reactions  . Etodolac Nausea And Vomiting    Pt states, "that medication made me sick".  . Aspirin Rash    Past Medical History:  Diagnosis Date  . Anxiety   . HTN (hypertension)   . Mitral valve prolapse     Family History  Problem Relation Age of Onset  . Hypertension Mother   . Hyperlipidemia Mother   . Heart disease Maternal Grandfather   . Mental illness Daughter        bipolar    Social History   Socioeconomic History  . Marital status: Married    Spouse name: Not on file  . Number of children: 2  . Years of education: Master's degree  . Highest education level: Not on file  Occupational History  . Occupation: Training and development officer: [employer]    CommentIT consultant  Tobacco Use  . Smoking status: Never Smoker  . Smokeless tobacco: Never Used  Substance and Sexual Activity  . Alcohol use: Yes    Alcohol/week: 3.0 - 5.0 standard drinks    Types: 3 - 5 Glasses of wine per week  . Drug use: No  . Sexual activity: Not on file  Other Topics  Concern  . Not on file  Social History Narrative   Lives at home with wife and son. Daughter is in college.    Education: Museum/gallery curator.    Exercise: No.   Social Determinants of Health   Financial Resource Strain:   . Difficulty of Paying Living Expenses:   Food Insecurity:   . Worried About Charity fundraiser in the Last Year:   . Arboriculturist in the Last Year:   Transportation Needs:   . Film/video editor (Medical):   Marland Kitchen Lack of Transportation (Non-Medical):   Physical Activity:   . Days of Exercise per Week:   . Minutes of Exercise per Session:   Stress:   . Feeling  of Stress :   Social Connections:   . Frequency of Communication with Friends and Family:   . Frequency of Social Gatherings with Friends and Family:   . Attends Religious Services:   . Active Member of Clubs or Organizations:   . Attends Archivist Meetings:   Marland Kitchen Marital Status:   Intimate Partner Violence:   . Fear of Current or Ex-Partner:   . Emotionally Abused:   Marland Kitchen Physically Abused:   . Sexually Abused:     Past Medical History, Surgical history, Social history, and Family history were reviewed and updated as appropriate.   Please see review of systems for further details on the patient's review from today.   Objective:   Physical Exam:  There were no vitals taken for this visit.  Physical Exam Constitutional:      General: He is not in acute distress.    Appearance: He is well-developed.  Musculoskeletal:        General: No deformity.  Neurological:     Mental Status: He is alert and oriented to person, place, and time.     Coordination: Coordination normal.  Psychiatric:        Attention and Perception: Attention normal. He is attentive.        Mood and Affect: Mood is anxious. Mood is not depressed. Affect is not labile, angry or inappropriate.        Speech: Speech normal.        Behavior: Behavior normal.        Thought Content: Thought content normal. Thought content does not include homicidal or suicidal ideation. Thought content does not include homicidal or suicidal plan.        Cognition and Memory: Cognition normal.        Judgment: Judgment normal.     Comments: Insight is good. Consitricted affect chronically     Lab Review:     Component Value Date/Time   NA 140 01/18/2018 1514   K 4.7 01/18/2018 1514   CL 99 01/18/2018 1514   CO2 21 01/18/2018 1514   GLUCOSE 95 01/18/2018 1514   GLUCOSE 85 01/04/2016 1414   BUN 13 01/18/2018 1514   CREATININE 0.97 01/18/2018 1514   CREATININE 0.84 01/04/2016 1414   CALCIUM 9.7 01/18/2018 1514    PROT 6.7 01/18/2018 1514   ALBUMIN 4.7 01/18/2018 1514   AST 31 01/18/2018 1514   ALT 53 (H) 01/18/2018 1514   ALKPHOS 58 01/18/2018 1514   BILITOT 0.6 01/18/2018 1514   GFRNONAA 92 01/18/2018 1514   GFRNONAA >60 10/31/2010 1455   GFRAA 106 01/18/2018 1514   GFRAA >60 10/31/2010 1455       Component Value Date/Time   WBC 8.3 01/18/2018 1514   WBC  6.6 01/04/2016 1414   RBC 5.09 01/18/2018 1514   RBC 5.40 01/04/2016 1414   HGB 15.3 01/18/2018 1514   HCT 44.3 01/18/2018 1514   PLT 284 01/18/2018 1514   MCV 87 01/18/2018 1514   MCH 30.1 01/18/2018 1514   MCH 29.4 01/04/2016 1414   MCHC 34.5 01/18/2018 1514   MCHC 33.6 01/04/2016 1414   RDW 12.1 (L) 01/18/2018 1514   LYMPHSABS 1.3 01/18/2018 1514   MONOABS 660 01/04/2016 1414   EOSABS 0.1 01/18/2018 1514   BASOSABS 0.1 01/18/2018 1514    No results found for: POCLITH, LITHIUM   No results found for: PHENYTOIN, PHENOBARB, VALPROATE, CBMZ   .res Assessment: Plan:    Claudius was seen today for follow-up.  Diagnoses and all orders for this visit:  Social anxiety disorder -     VALIUM 10 MG tablet; Take 1 tablet (10 mg total) by mouth 2 (two) times daily.  Insomnia due to mental condition -     zolpidem (AMBIEN CR) 12.5 MG CR tablet; Take 1 tablet (12.5 mg total) by mouth at bedtime.     No med changes per his request.   HE USES NAME BRAND ON VALIUM. Generic was less effective.  We discussed the short-term risks associated with benzodiazepines including sedation and increased fall risk among others.  Discussed long-term side effect risk including dependence, potential withdrawal symptoms, and the potential eventual dose-related risk of dementia.  But recent studies from 2020 dispute this association between benzodiazepines and dementia risk. Newer studies in 2020 do not support an association with dementia.   No evidence of abuse.  Because he is stable for several years on the same medications, follow-up 1  year.  Meredith Staggers, MD, DFAPA   Please see After Visit Summary for patient specific instructions.  No future appointments.  No orders of the defined types were placed in this encounter.   -------------------------------

## 2019-12-23 ENCOUNTER — Other Ambulatory Visit: Payer: Self-pay | Admitting: Psychiatry

## 2019-12-23 DIAGNOSIS — F4010 Social phobia, unspecified: Secondary | ICD-10-CM

## 2020-01-12 ENCOUNTER — Other Ambulatory Visit: Payer: Self-pay

## 2020-01-12 DIAGNOSIS — F4010 Social phobia, unspecified: Secondary | ICD-10-CM

## 2020-01-12 MED ORDER — VALIUM 5 MG PO TABS: ORAL_TABLET | ORAL | 0 refills | Status: DC

## 2020-01-12 NOTE — Telephone Encounter (Signed)
Pt called and said that the refill needs to be done no later tomorrow so insurance will cover it

## 2020-01-18 ENCOUNTER — Other Ambulatory Visit: Payer: Self-pay | Admitting: Psychiatry

## 2020-01-18 DIAGNOSIS — F5105 Insomnia due to other mental disorder: Secondary | ICD-10-CM

## 2020-01-20 NOTE — Telephone Encounter (Signed)
Next apt 07/06/20

## 2020-03-30 ENCOUNTER — Other Ambulatory Visit: Payer: Self-pay | Admitting: Psychiatry

## 2020-04-06 ENCOUNTER — Other Ambulatory Visit: Payer: Self-pay

## 2020-04-06 MED ORDER — VALIUM 5 MG PO TABS: ORAL_TABLET | ORAL | 0 refills | Status: DC

## 2020-06-29 ENCOUNTER — Ambulatory Visit (INDEPENDENT_AMBULATORY_CARE_PROVIDER_SITE_OTHER): Payer: BC Managed Care – PPO | Admitting: Psychiatry

## 2020-06-29 ENCOUNTER — Other Ambulatory Visit: Payer: Self-pay

## 2020-06-29 ENCOUNTER — Encounter: Payer: Self-pay | Admitting: Psychiatry

## 2020-06-29 DIAGNOSIS — F5105 Insomnia due to other mental disorder: Secondary | ICD-10-CM | POA: Diagnosis not present

## 2020-06-29 DIAGNOSIS — F4010 Social phobia, unspecified: Secondary | ICD-10-CM

## 2020-06-29 MED ORDER — ZOLPIDEM TARTRATE ER 12.5 MG PO TBCR: 12.5000 mg | EXTENDED_RELEASE_TABLET | Freq: Every day | ORAL | 1 refills | Status: DC

## 2020-06-29 MED ORDER — VALIUM 5 MG PO TABS: ORAL_TABLET | ORAL | 1 refills | Status: DC

## 2020-06-29 NOTE — Progress Notes (Signed)
MARVYN TORREZ [id number] 1970-03-22 50 y.o.  Subjective:   Patient ID:  AMIEL MCCAFFREY is a 50 y.o. (DOB 12/10/1970) male.  Chief Complaint:  Chief Complaint  Patient presents with  . Follow-up  . Social anxiety disorder    HPI ROGERICK BALDWIN presents to the office today for follow-up of social anxiety and insomnia.  seen in June 2020.  No meds were changed.  07/2019 appt without med changes: Takes diazepam 10 BID regularly and Ambien CR 12.5 mg HS and propranolol ER 60 daily.  No SE with meds.  06/29/2020 appt with following noted: For supply reasons had to switch to 5 mg Valium 4 daily.  Still getting Valium brand bc generic less effective.  It's still effective. Ambien CR effective  Recent PE and labs good.  Pretty well.  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    Patient reports stable mood and denies depressed or irritable moods.  Patient stable with anxiety.  No sig avoidance.  Patient denies difficulty with sleep initiation or maintenancewith Ambien 8 hours continues. Denies appetite disturbance.  Patient reports that energy and motivation have been good.  Patient denies any difficulty with concentration.  Patient denies any suicidal ideation.  Past Psychiatric Medication Trials:  Brand Valium, Ambien, Ambien CR, On diazepam since college. Lexapro, sertraline, Seroquel, Wellbutrin, propranolol, Depakote  Review of Systems:  Review of Systems  Cardiovascular: Negative for palpitations.  Musculoskeletal: Positive for arthralgias.  Neurological: Negative for dizziness, tremors and weakness.    Medications: I have reviewed the patient's current medications.  Current Outpatient Medications  Medication Sig Dispense Refill  . carisoprodol (SOMA) 350 MG tablet TAKE ONE TABLET BY MOUTH ONCE TO TWICE DAILY AS NEEDED FOR MUSCLE SPASM 60 tablet 5  . EPINEPHrine 0.3 mg/0.3 mL IJ SOAJ injection Inject into the muscle.    Marland Kitchen HYDROcodone-acetaminophen (NORCO) 7.5-325 MG tablet  Take 1 tablet by mouth every 6 (six) hours as needed for moderate pain.    Marland Kitchen lisinopril-hydrochlorothiazide (PRINZIDE,ZESTORETIC) 10-12.5 MG tablet Take 1 tablet by mouth daily. 90 tablet 3  . propranolol ER (INDERAL LA) 60 MG 24 hr capsule Take 1 capsule (60 mg total) by mouth daily. 90 capsule 3  . VALIUM 5 MG tablet Take 2 tablets by mouth twice daily 360 tablet 0  . zolpidem (AMBIEN CR) 12.5 MG CR tablet TAKE 1 TABLET BY MOUTH AT BEDTIME 90 tablet 1  . VALIUM 10 MG tablet Take 1 tablet by mouth twice daily (Patient not taking: Reported on 06/29/2020) 180 tablet 1   No current facility-administered medications for this visit.    Medication Side Effects: None  Allergies:  Allergies  Allergen Reactions  . Etodolac Nausea And Vomiting    Pt states, "that medication made me sick".  . Aspirin Rash    Past Medical History:  Diagnosis Date  . Anxiety   . HTN (hypertension)   . Mitral valve prolapse     Family History  Problem Relation Age of Onset  . Hypertension Mother   . Hyperlipidemia Mother   . Heart disease Maternal Grandfather   . Mental illness Daughter        bipolar    Social History   Socioeconomic History  . Marital status: Married    Spouse name: Not on file  . Number of children: 2  . Years of education: Master's degree  . Highest education level: Not on file  Occupational History  . 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 Smoking status: Never Smoker  . Smokeless tobacco: Never Used  Substance and Sexual Activity  . Alcohol use: Yes    Alcohol/week: 3.0 - 5.0 standard drinks    Types: 3 - 5 Glasses of wine per week  . Drug use: No  . Sexual activity: Not on file  Other Topics Concern  . Not on file  Social History Narrative   Lives at home with wife and son. Daughter is in college.    Education: Regulatory affairs officer.    Exercise: No.   Social Determinants of Health   Financial Resource Strain:  Not on file  Food Insecurity: Not on file  Transportation Needs: Not on file  Physical Activity: Not on file  Stress: Not on file  Social Connections: Not on file  Intimate Partner Violence: Not on file    Past Medical History, Surgical history, Social history, and Family history were reviewed and updated as appropriate.   Please see review of systems for further details on the patient's review from today.   Objective:   Physical Exam:  There were no vitals taken for this visit.  Physical Exam Constitutional:      General: He is not in acute distress.    Appearance: He is well-developed.  Musculoskeletal:        General: No deformity.  Neurological:     Mental Status: He is alert and oriented to person, place, and time.     Coordination: Coordination normal.  Psychiatric:        Attention and Perception: Attention normal. He is attentive.        Mood and Affect: Mood is anxious. Mood is not depressed. Affect is not labile, angry or inappropriate.        Speech: Speech normal.        Behavior: Behavior normal.        Thought Content: Thought content normal. Thought content does not include homicidal or suicidal ideation. Thought content does not include homicidal or suicidal plan.        Cognition and Memory: Cognition normal.        Judgment: Judgment normal.     Comments: Insight is good. No new concerns     Lab Review:     Component Value Date/Time   NA 140 01/18/2018 1514   K 4.7 01/18/2018 1514   CL 99 01/18/2018 1514   CO2 21 01/18/2018 1514   GLUCOSE 95 01/18/2018 1514   GLUCOSE 85 01/04/2016 1414   BUN 13 01/18/2018 1514   CREATININE 0.97 01/18/2018 1514   CREATININE 0.84 01/04/2016 1414   CALCIUM 9.7 01/18/2018 1514   PROT 6.7 01/18/2018 1514   ALBUMIN 4.7 01/18/2018 1514   AST 31 01/18/2018 1514   ALT 53 (H) 01/18/2018 1514   ALKPHOS 58 01/18/2018 1514   BILITOT 0.6 01/18/2018 1514   GFRNONAA 92 01/18/2018 1514   GFRNONAA >60 10/31/2010 1455    GFRAA 106 01/18/2018 1514   GFRAA >60 10/31/2010 1455       Component Value Date/Time   WBC 8.3 01/18/2018 1514   WBC 6.6 01/04/2016 1414   RBC 5.09 01/18/2018 1514   RBC 5.40 01/04/2016 1414   HGB 15.3 01/18/2018 1514   HCT 44.3 01/18/2018 1514   PLT 284 01/18/2018 1514   MCV 87 01/18/2018 1514   MCH 30.1 01/18/2018 1514   MCH 29.4 01/04/2016 1414   MCHC 34.5 01/18/2018 1514   MCHC 33.6 01/04/2016 1414  RDW 12.1 (L) 01/18/2018 1514   LYMPHSABS 1.3 01/18/2018 1514   MONOABS 660 01/04/2016 1414   EOSABS 0.1 01/18/2018 1514   BASOSABS 0.1 01/18/2018 1514    No results found for: POCLITH, LITHIUM   No results found for: PHENYTOIN, PHENOBARB, VALPROATE, CBMZ   .res Assessment: Plan:    There are no diagnoses linked to this encounter.   No med changes per his request.   HE USES NAME BRAND ON VALIUM. Generic was less effective.  We discussed the short-term risks associated with benzodiazepines including sedation and increased fall risk among others.  Discussed long-term side effect risk including dependence, potential withdrawal symptoms, and the potential eventual dose-related risk of dementia.  But recent studies from 2020 dispute this association between benzodiazepines and dementia risk. Newer studies in 2020 do not support an association with dementia.   No evidence of abuse.  Because he is stable for several years on the same medications, follow-up 1 year.  Meredith Staggers, MD, DFAPA   Please see After Visit Summary for patient specific instructions.  No future appointments.  No orders of the defined types were placed in this encounter.   -------------------------------

## 2020-07-06 ENCOUNTER — Ambulatory Visit: Payer: BC Managed Care – PPO | Admitting: Psychiatry

## 2020-12-29 ENCOUNTER — Other Ambulatory Visit: Payer: Self-pay | Admitting: Psychiatry

## 2020-12-29 DIAGNOSIS — F4010 Social phobia, unspecified: Secondary | ICD-10-CM

## 2021-01-08 ENCOUNTER — Other Ambulatory Visit: Payer: Self-pay | Admitting: Psychiatry

## 2021-01-08 DIAGNOSIS — F5105 Insomnia due to other mental disorder: Secondary | ICD-10-CM

## 2021-03-18 ENCOUNTER — Other Ambulatory Visit: Payer: Self-pay | Admitting: Psychiatry

## 2021-03-18 DIAGNOSIS — F4010 Social phobia, unspecified: Secondary | ICD-10-CM

## 2021-04-03 ENCOUNTER — Other Ambulatory Visit: Payer: Self-pay | Admitting: Psychiatry

## 2021-04-03 DIAGNOSIS — F5105 Insomnia due to other mental disorder: Secondary | ICD-10-CM

## 2021-04-04 ENCOUNTER — Telehealth: Payer: Self-pay | Admitting: Psychiatry

## 2021-04-04 NOTE — Telephone Encounter (Signed)
Filled 01/17/21 for 90 days , due 04/17/21

## 2021-04-04 NOTE — Telephone Encounter (Signed)
Patient called regarding prescription for Ambien. States that he spoke with pharmacy and was told it was rejected. Ph: [phone]. Appt 5/9. Please call to discuss if needed. Pharmacy [facility] [address] [place]

## 2021-04-13 ENCOUNTER — Other Ambulatory Visit: Payer: Self-pay | Admitting: Psychiatry

## 2021-04-13 DIAGNOSIS — F5105 Insomnia due to other mental disorder: Secondary | ICD-10-CM

## 2021-04-14 NOTE — Telephone Encounter (Signed)
Filled 01/17/21 appt on 5/9

## 2021-06-21 ENCOUNTER — Encounter: Payer: Self-pay | Admitting: Psychiatry

## 2021-06-21 ENCOUNTER — Ambulatory Visit (INDEPENDENT_AMBULATORY_CARE_PROVIDER_SITE_OTHER): Payer: BC Managed Care – PPO | Admitting: Psychiatry

## 2021-06-21 DIAGNOSIS — F4010 Social phobia, unspecified: Secondary | ICD-10-CM | POA: Diagnosis not present

## 2021-06-21 DIAGNOSIS — F5105 Insomnia due to other mental disorder: Secondary | ICD-10-CM

## 2021-06-21 MED ORDER — ZOLPIDEM TARTRATE ER 12.5 MG PO TBCR: 12.5000 mg | EXTENDED_RELEASE_TABLET | Freq: Every day | ORAL | 1 refills | Status: DC

## 2021-06-21 MED ORDER — VALIUM 5 MG PO TABS: 10.0000 mg | ORAL_TABLET | Freq: Two times a day (BID) | ORAL | 1 refills | Status: DC

## 2021-06-21 NOTE — Progress Notes (Signed)
[name] ?[id number] ?04/27/1970 ?51 y.o. ? ?Subjective:  ? ?Patient ID:  Jesse Stevenson is a 51 y.o. (DOB [birth year]) male. ? ?Chief Complaint:  ?Chief Complaint  ?Patient presents with  ? Follow-up  ? Anxiety  ? Sleeping Problem  ? ? ?HPI ?Jesse Stevenson presents to the office today for follow-up of social anxiety and insomnia. ? ?seen in June 2020.  No meds were changed. ? ?07/2019 appt without med changes: ?Takes diazepam 10 BID regularly and Ambien CR 12.5 mg HS and propranolol ER 60 daily.  No SE with meds. ? ?06/29/2020 appt with following noted: ?For supply reasons had to switch to 5 mg Valium 4 daily.  Still getting Valium brand bc generic less effective.  It's still effective. ?Ambien CR effective  ?Recent PE and labs good. ?Plan: continue Valium brand 20 mg daily and Ambien CR 12.5 mg HS ? ?06/21/21 appt noted: ?Pretty well.  Diazepam 5 mg QID and Ambien Cr and propranolol ER 60 mg daily. ?No SE.  Still pleased with meds.   ?Regular PE. ?Uses Auora ring and 8 hours sleep. ?Flies regularly. ? ?Pretty well.  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    Patient reports stable mood and denies depressed or irritable moods.  Patient stable with anxiety and managed with meds..  No sig avoidance.  Patient denies difficulty with sleep initiation or maintenancewith Ambien 8 hours continues. Denies appetite disturbance.  Patient reports that energy and motivation have been good.  Patient denies any difficulty with concentration.  Patient denies any suicidal ideation. ? ?Past Psychiatric Medication Trials:  Brand Valium, Ambien, Ambien CR, ?On diazepam since college. ?Lexapro, sertraline, Seroquel, Wellbutrin, propranolol, Depakote ? ?Review of Systems:  ?Review of Systems  ?Cardiovascular:  Negative for palpitations.  ?Musculoskeletal:  Positive for arthralgias.  ?Neurological:  Negative for dizziness and tremors.  ? ?Medications: I have reviewed the patient's current medications. ? ?Current Outpatient  Medications  ?Medication Sig Dispense Refill  ? carisoprodol (SOMA) 350 MG tablet TAKE ONE TABLET BY MOUTH ONCE TO TWICE DAILY AS NEEDED FOR MUSCLE SPASM 60 tablet 5  ? EPINEPHrine 0.3 mg/0.3 mL IJ SOAJ injection Inject into the muscle.    ? HYDROcodone-acetaminophen (NORCO) 7.5-325 MG tablet Take 1 tablet by mouth every 6 (six) hours as needed for moderate pain.    ? lisinopril-hydrochlorothiazide (PRINZIDE,ZESTORETIC) 10-12.5 MG tablet Take 1 tablet by mouth daily. 90 tablet 3  ? propranolol ER (INDERAL LA) 60 MG 24 hr capsule Take 1 capsule (60 mg total) by mouth daily. 90 capsule 3  ? VALIUM 5 MG tablet Take 2 tablets (10 mg total) by mouth 2 (two) times daily. 360 tablet 1  ? zolpidem (AMBIEN CR) 12.5 MG CR tablet Take 1 tablet (12.5 mg total) by mouth at bedtime. 90 tablet 1  ? ?No current facility-administered medications for this visit.  ? ? ?Medication Side Effects: None ? ?Allergies:  ?Allergies  ?Allergen Reactions  ? Etodolac Nausea And Vomiting  ?  Pt states, "that medication made me sick".  ? Aspirin Rash  ? ? ?Past Medical History:  ?Diagnosis Date  ? Anxiety   ? HTN (hypertension)   ? Mitral valve prolapse   ? ? ?Family History  ?Problem Relation Age of Onset  ? Hypertension Mother   ? Hyperlipidemia Mother   ? Heart disease Maternal Grandfather   ? Mental illness Daughter   ?     bipolar  ? ? ?Social History  ? ?Socioeconomic History  ? Marital status:  Married  ?  Spouse name: Not on file  ? Number of children: 2  ? Years of education: Master's degree  ? Highest education level: Not on file  ?Occupational History  ? Occupation: Financial trader  ?  Employer: [employer]  ?  Comment: biologist  ?Tobacco Use  ? Smoking status: Never  ? Smokeless tobacco: Never  ?Substance and Sexual Activity  ? Alcohol use: Yes  ?  Alcohol/week: 3.0 - 5.0 standard drinks  ?  Types: 3 - 5 Glasses of wine per week  ? Drug use: No  ? Sexual activity: Not on file  ?Other Topics Concern  ? Not on file  ?Social History  Narrative  ? Lives at home with wife and son. Daughter is in college.   ? Education: Regulatory affairs officer.   ? Exercise: No.  ? ?Social Determinants of Health  ? ?Financial Resource Strain: Not on file  ?Food Insecurity: Not on file  ?Transportation Needs: Not on file  ?Physical Activity: Not on file  ?Stress: Not on file  ?Social Connections: Not on file  ?Intimate Partner Violence: Not on file  ? ? ?Past Medical History, Surgical history, Social history, and Family history were reviewed and updated as appropriate.  ? ?Please see review of systems for further details on the patient's review from today.  ? ?Objective:  ? ?Physical Exam:  ?There were no vitals taken for this visit. ? ?Physical Exam ?Constitutional:   ?   General: He is not in acute distress. ?   Appearance: He is well-developed.  ?Musculoskeletal:     ?   General: No deformity.  ?Neurological:  ?   Mental Status: He is alert and oriented to person, place, and time.  ?   Coordination: Coordination normal.  ?Psychiatric:     ?   Attention and Perception: Attention normal. He is attentive.     ?   Mood and Affect: Mood is anxious. Mood is not depressed. Affect is not labile, angry or inappropriate.     ?   Speech: Speech normal.     ?   Behavior: Behavior normal.     ?   Thought Content: Thought content normal. Thought content is not delusional. Thought content does not include homicidal or suicidal ideation. Thought content does not include suicidal plan.     ?   Cognition and Memory: Cognition normal.     ?   Judgment: Judgment normal.  ?   Comments: Insight is good. ?No new concerns  ? ? ?Lab Review:  ?   ?Component Value Date/Time  ? NA 140 01/18/2018 1514  ? K 4.7 01/18/2018 1514  ? CL 99 01/18/2018 1514  ? CO2 21 01/18/2018 1514  ? GLUCOSE 95 01/18/2018 1514  ? GLUCOSE 85 01/04/2016 1414  ? BUN 13 01/18/2018 1514  ? CREATININE 0.97 01/18/2018 1514  ? CREATININE 0.84 01/04/2016 1414  ? CALCIUM 9.7 01/18/2018 1514  ? PROT 6.7 01/18/2018 1514  ?  ALBUMIN 4.7 01/18/2018 1514  ? AST 31 01/18/2018 1514  ? ALT 53 (H) 01/18/2018 1514  ? ALKPHOS 58 01/18/2018 1514  ? BILITOT 0.6 01/18/2018 1514  ? GFRNONAA 92 01/18/2018 1514  ? GFRNONAA >60 10/31/2010 1455  ? GFRAA 106 01/18/2018 1514  ? GFRAA >60 10/31/2010 1455  ? ? ?   ?Component Value Date/Time  ? WBC 8.3 01/18/2018 1514  ? WBC 6.6 01/04/2016 1414  ? RBC 5.09 01/18/2018 1514  ? RBC 5.40 01/04/2016 1414  ?  HGB 15.3 01/18/2018 1514  ? HCT 44.3 01/18/2018 1514  ? PLT 284 01/18/2018 1514  ? MCV 87 01/18/2018 1514  ? MCH 30.1 01/18/2018 1514  ? MCH 29.4 01/04/2016 1414  ? MCHC 34.5 01/18/2018 1514  ? MCHC 33.6 01/04/2016 1414  ? RDW 12.1 (L) 01/18/2018 1514  ? LYMPHSABS 1.3 01/18/2018 1514  ? MONOABS 660 01/04/2016 1414  ? EOSABS 0.1 01/18/2018 1514  ? BASOSABS 0.1 01/18/2018 1514  ? ? ?No results found for: POCLITH, LITHIUM  ? ?No results found for: PHENYTOIN, PHENOBARB, VALPROATE, CBMZ  ? ?.res ?Assessment: Plan:   ? ?Gardiner Barefootathaniel was seen today for follow-up, anxiety and sleeping problem. ? ?Diagnoses and all orders for this visit: ? ?Social anxiety disorder ?-     VALIUM 5 MG tablet; Take 2 tablets (10 mg total) by mouth 2 (two) times daily. ? ?Insomnia due to mental condition ?-     zolpidem (AMBIEN CR) 12.5 MG CR tablet; Take 1 tablet (12.5 mg total) by mouth at bedtime. ? ? ? ? ?No med changes per his request.   ?HE USES NAME BRAND ON VALIUM. Generic was less effective. ? ?We discussed the short-term risks associated with benzodiazepines including sedation and increased fall risk among others.  Discussed long-term side effect risk including dependence, potential withdrawal symptoms, and the potential eventual dose-related risk of dementia.  But recent studies from 2020 dispute this association between benzodiazepines and dementia risk. Newer studies in 2020 do not support an association with dementia. ?  No evidence of abuse. ? ?Because he is stable for several years on the same medications, follow-up 1  year. ? ?Jesse Staggersarey Cottle, MD, DFAPA ? ? ?Please see After Visit Summary for patient specific instructions. ? ?No future appointments. ? ?No orders of the defined types were placed in this encounter. ? ? ?-----------------------------

## 2021-12-10 ENCOUNTER — Other Ambulatory Visit: Payer: Self-pay | Admitting: Psychiatry

## 2021-12-10 DIAGNOSIS — F4010 Social phobia, unspecified: Secondary | ICD-10-CM

## 2021-12-24 ENCOUNTER — Other Ambulatory Visit: Payer: Self-pay | Admitting: Psychiatry

## 2021-12-24 DIAGNOSIS — F5105 Insomnia due to other mental disorder: Secondary | ICD-10-CM

## 2021-12-26 NOTE — Telephone Encounter (Signed)
Filled 8/25 appt 06/26/22

## 2022-03-27 ENCOUNTER — Other Ambulatory Visit (HOSPITAL_COMMUNITY): Payer: Self-pay

## 2022-03-28 ENCOUNTER — Other Ambulatory Visit (HOSPITAL_COMMUNITY): Payer: Self-pay

## 2022-03-28 MED ORDER — HYDROCODONE-ACETAMINOPHEN 10-325 MG PO TABS
1.0000 | ORAL_TABLET | Freq: Three times a day (TID) | ORAL | 0 refills | Status: AC | PRN
  Filled 2022-03-28: qty 90, 30d supply, fill #0

## 2022-06-26 ENCOUNTER — Encounter: Payer: Self-pay | Admitting: Psychiatry

## 2022-06-26 ENCOUNTER — Ambulatory Visit (INDEPENDENT_AMBULATORY_CARE_PROVIDER_SITE_OTHER): Payer: BC Managed Care – PPO | Admitting: Psychiatry

## 2022-06-26 DIAGNOSIS — F5105 Insomnia due to other mental disorder: Secondary | ICD-10-CM

## 2022-06-26 DIAGNOSIS — G4733 Obstructive sleep apnea (adult) (pediatric): Secondary | ICD-10-CM | POA: Diagnosis not present

## 2022-06-26 DIAGNOSIS — F4010 Social phobia, unspecified: Secondary | ICD-10-CM

## 2022-06-26 MED ORDER — ZOLPIDEM TARTRATE ER 12.5 MG PO TBCR: 12.5000 mg | EXTENDED_RELEASE_TABLET | Freq: Every day | ORAL | 1 refills | Status: DC

## 2022-06-26 MED ORDER — VALIUM 5 MG PO TABS: 10.0000 mg | ORAL_TABLET | Freq: Two times a day (BID) | ORAL | 1 refills | Status: DC

## 2022-06-26 NOTE — Progress Notes (Signed)
Jesse Stevenson [id number] 06/17/1970 52 y.o.  Subjective:   Patient ID:  Jesse Stevenson is a 52 y.o. (DOB 08-12-1970) male.  Chief Complaint:  Chief Complaint  Patient presents with   Follow-up   Anxiety   Sleeping Problem    HPI Jesse Stevenson presents to the office today for follow-up of social anxiety and insomnia.  seen in June 2020.  No meds were changed.  07/2019 appt without med changes: Takes diazepam 10 BID regularly and Ambien CR 12.5 mg HS and propranolol ER 60 daily.  No SE with meds.  06/29/2020 appt with following noted: For supply reasons had to switch to 5 mg Valium 4 daily.  Still getting Valium brand bc generic less effective.  It's still effective. Ambien CR effective  Recent PE and labs good. Plan: continue Valium brand 20 mg daily and Ambien CR 12.5 mg HS  06/21/21 appt noted: Pretty well.  Diazepam 5 mg QID and Ambien Cr and propranolol ER 60 mg daily. No SE.  Still pleased with meds.   Regular PE. Uses Auora ring and 8 hours sleep. Flies regularly. Plan: no changes  06/26/22 appt: Psych med Valium brand 5 mg BID, zolpidem CR 12.5 mg HS. Also hydrocodone 10 QID.   Still doing well with meds without complaints. Recently dx OSA, CPAP for severe. Anxiety has been fine and pleased with meds.  Pretty well.  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    Patient reports stable mood and denies depressed or irritable moods.  Patient stable with anxiety and managed with meds..  No sig avoidance.  Patient denies difficulty with sleep initiation or maintenancewith Ambien 8 hours continues. Denies appetite disturbance.  Patient reports that energy and motivation have been good.  Patient denies any difficulty with concentration.  Patient denies any suicidal ideation.  Past Psychiatric Medication Trials:  Brand Valium, Ambien, Ambien CR, On diazepam since college. Lexapro, sertraline, Seroquel, Wellbutrin, propranolol, Depakote  Review of Systems:  Review of  Systems  Cardiovascular:  Negative for palpitations.  Musculoskeletal:  Positive for arthralgias.  Neurological:  Negative for dizziness and tremors.    Medications: I have reviewed the patient's current medications.  Current Outpatient Medications  Medication Sig Dispense Refill   carisoprodol (SOMA) 350 MG tablet TAKE ONE TABLET BY MOUTH ONCE TO TWICE DAILY AS NEEDED FOR MUSCLE SPASM 60 tablet 5   EPINEPHrine 0.3 mg/0.3 mL IJ SOAJ injection Inject into the muscle.     HYDROcodone-acetaminophen (NORCO) 10-325 MG tablet Take 1 tablet by mouth 3 (three) times daily as needed for pain 90 tablet 0   HYDROcodone-acetaminophen (NORCO) 7.5-325 MG tablet Take 1 tablet by mouth every 6 (six) hours as needed for moderate pain.     lisinopril-hydrochlorothiazide (PRINZIDE,ZESTORETIC) 10-12.5 MG tablet Take 1 tablet by mouth daily. 90 tablet 3   propranolol ER (INDERAL LA) 60 MG 24 hr capsule Take 1 capsule (60 mg total) by mouth daily. 90 capsule 3   VALIUM 5 MG tablet Take 2 tablets (10 mg total) by mouth 2 (two) times daily. 360 tablet 1   zolpidem (AMBIEN CR) 12.5 MG CR tablet Take 1 tablet (12.5 mg total) by mouth at bedtime. 90 tablet 1   No current facility-administered medications for this visit.    Medication Side Effects: None  Allergies:  Allergies  Allergen Reactions   Etodolac Nausea And Vomiting    Pt states, "that medication made me sick".   Aspirin Rash    Past Medical History:  Diagnosis Date  Anxiety    HTN (hypertension)    Mitral valve prolapse     Family History  Problem Relation Age of Onset   Hypertension Mother    Hyperlipidemia Mother    Heart disease Maternal Grandfather    Mental illness Daughter        bipolar    Social History   Socioeconomic History   Marital status: Married    Spouse name: Not on file   Number of children: 2   Years of education: Master's degree   Highest education level: Not on file  Occupational History   Occupation:  Training and development officer: [employer]    CommentIT consultant  Tobacco Use   Smoking status: Never   Smokeless tobacco: Never  Substance and Sexual Activity   Alcohol use: Yes    Alcohol/week: 3.0 - 5.0 standard drinks of alcohol    Types: 3 - 5 Glasses of wine per week   Drug use: No   Sexual activity: Not on file  Other Topics Concern   Not on file  Social History Narrative   Lives at home with wife and son. Daughter is in college.    Education: Regulatory affairs officer.    Exercise: No.   Social Determinants of Health   Financial Resource Strain: Not on file  Food Insecurity: Not on file  Transportation Needs: Not on file  Physical Activity: Not on file  Stress: Not on file  Social Connections: Not on file  Intimate Partner Violence: Not on file    Past Medical History, Surgical history, Social history, and Family history were reviewed and updated as appropriate.   Please see review of systems for further details on the patient's review from today.   Objective:   Physical Exam:  There were no vitals taken for this visit.  Physical Exam Constitutional:      General: He is not in acute distress.    Appearance: He is well-developed.  Musculoskeletal:        General: No deformity.  Neurological:     Mental Status: He is alert and oriented to person, place, and time.     Coordination: Coordination normal.  Psychiatric:        Attention and Perception: Attention normal. He is attentive.        Mood and Affect: Mood is anxious. Mood is not depressed. Affect is not labile, angry or inappropriate.        Speech: Speech normal.        Behavior: Behavior normal.        Thought Content: Thought content normal. Thought content is not delusional. Thought content does not include homicidal or suicidal ideation. Thought content does not include suicidal plan.        Cognition and Memory: Cognition normal.        Judgment: Judgment normal.     Comments: Insight is good. No new  concerns     Lab Review:     Component Value Date/Time   NA 140 01/18/2018 1514   K 4.7 01/18/2018 1514   CL 99 01/18/2018 1514   CO2 21 01/18/2018 1514   GLUCOSE 95 01/18/2018 1514   GLUCOSE 85 01/04/2016 1414   BUN 13 01/18/2018 1514   CREATININE 0.97 01/18/2018 1514   CREATININE 0.84 01/04/2016 1414   CALCIUM 9.7 01/18/2018 1514   PROT 6.7 01/18/2018 1514   ALBUMIN 4.7 01/18/2018 1514   AST 31 01/18/2018 1514   ALT 53 (H) 01/18/2018 1514  ALKPHOS 58 01/18/2018 1514   BILITOT 0.6 01/18/2018 1514   GFRNONAA 92 01/18/2018 1514   GFRNONAA >60 10/31/2010 1455   GFRAA 106 01/18/2018 1514   GFRAA >60 10/31/2010 1455       Component Value Date/Time   WBC 8.3 01/18/2018 1514   WBC 6.6 01/04/2016 1414   RBC 5.09 01/18/2018 1514   RBC 5.40 01/04/2016 1414   HGB 15.3 01/18/2018 1514   HCT 44.3 01/18/2018 1514   PLT 284 01/18/2018 1514   MCV 87 01/18/2018 1514   MCH 30.1 01/18/2018 1514   MCH 29.4 01/04/2016 1414   MCHC 34.5 01/18/2018 1514   MCHC 33.6 01/04/2016 1414   RDW 12.1 (L) 01/18/2018 1514   LYMPHSABS 1.3 01/18/2018 1514   MONOABS 660 01/04/2016 1414   EOSABS 0.1 01/18/2018 1514   BASOSABS 0.1 01/18/2018 1514    No results found for: "POCLITH", "LITHIUM"   No results found for: "PHENYTOIN", "PHENOBARB", "VALPROATE", "CBMZ"   .res Assessment: Plan:    Jesse Stevenson was seen today for follow-up, anxiety and sleeping problem.  Diagnoses and all orders for this visit:  Social anxiety disorder -     VALIUM 5 MG tablet; Take 2 tablets (10 mg total) by mouth 2 (two) times daily.  Insomnia due to mental condition -     zolpidem (AMBIEN CR) 12.5 MG CR tablet; Take 1 tablet (12.5 mg total) by mouth at bedtime.  Obstructive sleep apnea     No med changes per his request.   HE USES NAME BRAND ON VALIUM. Generic was less effective.  We discussed the short-term risks associated with benzodiazepines including sedation and increased fall risk among others.   Discussed long-term side effect risk including dependence, potential withdrawal symptoms, and the potential eventual dose-related risk of dementia.  But recent studies from 2020 dispute this association between benzodiazepines and dementia risk. Newer studies in 2020 do not support an association with dementia.   No evidence of abuse.  Because he is stable for several years on the same medications, follow-up 1 year.  Meredith Staggers, MD, DFAPA   Please see After Visit Summary for patient specific instructions.  No future appointments.   No orders of the defined types were placed in this encounter.   -------------------------------

## 2022-09-26 ENCOUNTER — Other Ambulatory Visit (HOSPITAL_COMMUNITY): Payer: Self-pay

## 2022-09-26 ENCOUNTER — Telehealth: Payer: Self-pay | Admitting: Psychiatry

## 2022-09-26 MED ORDER — ZOLPIDEM TARTRATE ER 12.5 MG PO TBCR
12.5000 mg | EXTENDED_RELEASE_TABLET | Freq: Every day | ORAL | 1 refills | Status: DC
  Filled 2022-09-26 (×2): qty 90, 90d supply, fill #0

## 2022-09-26 NOTE — Telephone Encounter (Signed)
Noted  

## 2022-09-26 NOTE — Telephone Encounter (Signed)
Pt called and said  that he found the generic ambien at [facility] and there are going to transfer from pharmacy to them. So please disregard the first message

## 2022-09-26 NOTE — Telephone Encounter (Signed)
PT cannot get Zolpidem, not at [facility], or [facility] or other pharmacies. Can you send in an RX for Brand Ambien, because they can order that. Send to [facility] for 90 days, please.

## 2022-12-14 ENCOUNTER — Other Ambulatory Visit: Payer: Self-pay | Admitting: Psychiatry

## 2022-12-14 DIAGNOSIS — F4010 Social phobia, unspecified: Secondary | ICD-10-CM

## 2022-12-21 ENCOUNTER — Other Ambulatory Visit: Payer: Self-pay | Admitting: Psychiatry

## 2022-12-21 ENCOUNTER — Other Ambulatory Visit (HOSPITAL_COMMUNITY): Payer: Self-pay

## 2022-12-21 MED ORDER — ZOLPIDEM TARTRATE ER 12.5 MG PO TBCR
12.5000 mg | EXTENDED_RELEASE_TABLET | Freq: Every day | ORAL | 1 refills | Status: DC
  Filled 2022-12-21 – 2022-12-22 (×2): qty 90, 90d supply, fill #0
  Filled 2023-03-24: qty 90, 90d supply, fill #1

## 2022-12-21 NOTE — Telephone Encounter (Signed)
LF 08/14; LV 05/13; NV 05/06

## 2022-12-22 ENCOUNTER — Other Ambulatory Visit (HOSPITAL_COMMUNITY): Payer: Self-pay

## 2023-03-16 ENCOUNTER — Other Ambulatory Visit: Payer: Self-pay | Admitting: Psychiatry

## 2023-03-16 DIAGNOSIS — F4010 Social phobia, unspecified: Secondary | ICD-10-CM

## 2023-03-16 NOTE — Telephone Encounter (Signed)
Lf 11/6 lv 5/13

## 2023-03-19 ENCOUNTER — Telehealth: Payer: Self-pay | Admitting: Psychiatry

## 2023-03-20 ENCOUNTER — Other Ambulatory Visit: Payer: Self-pay

## 2023-03-20 DIAGNOSIS — F4010 Social phobia, unspecified: Secondary | ICD-10-CM

## 2023-03-20 MED ORDER — DIAZEPAM 5 MG PO TABS: ORAL_TABLET | ORAL | 0 refills | Status: DC

## 2023-03-20 NOTE — Telephone Encounter (Signed)
Pt needs the generic of the valium sent in to the pharmacy. Insurance will not cover the brand name. Please cancel andd re send

## 2023-03-20 NOTE — Telephone Encounter (Signed)
Sent diazepam 5 mg to [facility].

## 2023-03-21 NOTE — Telephone Encounter (Signed)
 Pt picked up BRAND Valium  5 mg tablet on 12/20/22 looks like he has been getting BRAND. I will submit a Prior Authorization to get covered.

## 2023-03-21 NOTE — Telephone Encounter (Signed)
 PA sent for Brand Valium  5 mg takes 2 tablets twice a day #120 for 30 days with [employer] [place], pending response  Is there a reason he can't get 10 mg tablets instead?

## 2023-03-21 NOTE — Telephone Encounter (Signed)
 [facility] called yesterday at 5:44pm and LM that the insurance won't cover brand Valium .  They need to fill as generic and they requested a new prescription allowing to substitute for the generic.   There phone # is [phone].

## 2023-03-21 NOTE — Telephone Encounter (Signed)
 He has been taking brand Valium  5 mg QID, but if it helps he could switch to brand 10 mg tablets, 1/2 QID.

## 2023-03-22 NOTE — Telephone Encounter (Signed)
 Response from [employer] is DENIED for BRAND Valium  without documentation why not able to take A-rated generic equivalent and had a life threatening side effect requiring medical intervention that did not occur with the brand equivalent. Pt must also have tried and failed ALL formulary alternatives such as alprazolam, lorazepam, etc.   Office note was sent with PA.

## 2023-03-23 NOTE — Telephone Encounter (Signed)
 Pt did pick up generic Valium  5 mg on 03/20/23 #120 for 30 day supply.

## 2023-03-23 NOTE — Telephone Encounter (Signed)
 Ok.  He will have to use generic or pay cash bc insurance won't cover brand anymore.

## 2023-03-25 ENCOUNTER — Other Ambulatory Visit: Payer: Self-pay

## 2023-03-26 ENCOUNTER — Other Ambulatory Visit: Payer: Self-pay

## 2023-04-16 ENCOUNTER — Other Ambulatory Visit: Payer: Self-pay | Admitting: Psychiatry

## 2023-06-19 ENCOUNTER — Ambulatory Visit (INDEPENDENT_AMBULATORY_CARE_PROVIDER_SITE_OTHER): Payer: BC Managed Care – PPO | Admitting: Psychiatry

## 2023-06-19 ENCOUNTER — Encounter: Payer: Self-pay | Admitting: Psychiatry

## 2023-06-19 DIAGNOSIS — G4733 Obstructive sleep apnea (adult) (pediatric): Secondary | ICD-10-CM

## 2023-06-19 DIAGNOSIS — F5105 Insomnia due to other mental disorder: Secondary | ICD-10-CM | POA: Diagnosis not present

## 2023-06-19 DIAGNOSIS — F4010 Social phobia, unspecified: Secondary | ICD-10-CM

## 2023-06-19 MED ORDER — DIAZEPAM 5 MG PO TABS: 5.0000 mg | ORAL_TABLET | Freq: Four times a day (QID) | ORAL | 1 refills | Status: DC

## 2023-06-19 MED ORDER — ZOLPIDEM TARTRATE ER 12.5 MG PO TBCR: 12.5000 mg | EXTENDED_RELEASE_TABLET | Freq: Every day | ORAL | 1 refills | Status: DC

## 2023-06-19 NOTE — Progress Notes (Signed)
 Jesse Stevenson [id number] 12-29-1970 53 y.o.  Subjective:   Patient ID:  Jesse Stevenson is a 53 y.o. (DOB [birth year]) male.  Chief Complaint:  Chief Complaint  Patient presents with   Follow-up   Anxiety   Sleeping Problem    HPI Jesse Stevenson presents to the office today for follow-up of social anxiety and insomnia.  seen in June 2020.  No meds were changed.  07/2019 appt without med changes: Takes diazepam  10 BID regularly and Ambien  CR 12.5 mg HS and propranolol  ER 60 daily.  No SE with meds.  06/29/2020 appt with following noted: For supply reasons had to switch to 5 mg Valium  4 daily.  Still getting Valium  brand bc generic less effective.  It's still effective. Ambien  CR effective  Recent PE and labs good. Plan: continue Valium  brand 20 mg daily and Ambien  CR 12.5 mg HS  06/21/21 appt noted: Pretty well.  Diazepam  5 mg QID and Ambien  Cr and propranolol  ER 60 mg daily. No SE.  Still pleased with meds.   Regular PE. Uses [place] ring and 8 hours sleep. Flies regularly. Plan: no changes  06/26/22 appt: Psych med Valium  brand 5 mg BID, zolpidem  CR 12.5 mg HS. Also hydrocodone  10 QID.   Still doing well with meds without complaints. Recently dx OSA, CPAP for severe. Anxiety has been fine and pleased with meds. Pretty well.  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    Patient reports stable mood and denies depressed or irritable moods.  Patient stable with anxiety and managed with meds..  No sig avoidance.  Patient denies difficulty with sleep initiation or maintenancewith Ambien  8 hours continues. Denies appetite disturbance.  Patient reports that energy and motivation have been good.  Patient denies any difficulty with concentration.  Patient denies any suicidal ideation.  06/19/23 appt noted: Meds as above Had to switch to generic diazepam  but increased dose diazepam  5 mg QID. Anxiety been manageable.  Sleep good with zolpidem  CR.  Avg 7-8 hours.   No SE.   Using CPAP  and sleep quality is better and sleeping better with 95% compliant.  Disc desire for concealed carry permit.  No history of violent or aggressive .     Past Psychiatric Medication Trials:  Brand Valium , Ambien , Ambien  CR, On diazepam  since college. Lexapro, sertraline, Seroquel, Wellbutrin, propranolol , Depakote  Review of Systems:  Review of Systems  Cardiovascular:  Negative for palpitations.  Musculoskeletal:  Positive for arthralgias.  Neurological:  Negative for dizziness, tremors and weakness.    Medications: I have reviewed the patient's current medications.  Current Outpatient Medications  Medication Sig Dispense Refill   carisoprodol  (SOMA ) 350 MG tablet TAKE ONE TABLET BY MOUTH ONCE TO TWICE DAILY AS NEEDED FOR MUSCLE SPASM 60 tablet 5   EPINEPHrine  0.3 mg/0.3 mL IJ SOAJ injection Inject into the muscle.     HYDROcodone -acetaminophen  (NORCO) 10-325 MG tablet Take 1 tablet by mouth 3 (three) times daily as needed for pain 90 tablet 0   HYDROcodone -acetaminophen  (NORCO) 7.5-325 MG tablet Take 1 tablet by mouth every 6 (six) hours as needed for moderate pain.     propranolol  ER (INDERAL  LA) 60 MG 24 hr capsule Take 1 capsule (60 mg total) by mouth daily. 90 capsule 3   diazepam  (VALIUM ) 5 MG tablet Take 1 tablet (5 mg total) by mouth in the morning, at noon, in the evening, and at bedtime. 360 tablet 1   lisinopril -hydrochlorothiazide  (PRINZIDE ,ZESTORETIC ) 10-12.5 MG tablet Take 1 tablet by mouth  daily. 90 tablet 3   zolpidem  (AMBIEN  CR) 12.5 MG CR tablet Take 1 tablet (12.5 mg total) by mouth at bedtime. 90 tablet 1   No current facility-administered medications for this visit.    Medication Side Effects: None  Allergies:  Allergies  Allergen Reactions   Etodolac  Nausea And Vomiting    Pt states, "that medication made me sick".   Aspirin Rash    Past Medical History:  Diagnosis Date   Anxiety    HTN (hypertension)    Mitral valve prolapse     Family History   Problem Relation Age of Onset   Hypertension Mother    Hyperlipidemia Mother    Heart disease Maternal Grandfather    Mental illness Daughter        bipolar    Social History   Socioeconomic History   Marital status: Married    Spouse name: Not on file   Number of children: 2   Years of education: Master's degree   Highest education level: Not on file  Occupational History   Occupation: Training and development officer: [employer]    CommentIT consultant  Tobacco Use   Smoking status: Never   Smokeless tobacco: Never  Substance and Sexual Activity   Alcohol use: Yes    Alcohol/week: 3.0 - 5.0 standard drinks of alcohol    Types: 3 - 5 Glasses of wine per week   Drug use: No   Sexual activity: Not on file  Other Topics Concern   Not on file  Social History Narrative   Lives at home with wife and son. Daughter is in college.    Education: Regulatory affairs officer.    Exercise: No.   Social Drivers of Corporate investment banker Strain: Not on file  Food Insecurity: Not on file  Transportation Needs: Not on file  Physical Activity: Not on file  Stress: Not on file  Social Connections: Not on file  Intimate Partner Violence: Not on file    Past Medical History, Surgical history, Social history, and Family history were reviewed and updated as appropriate.   Please see review of systems for further details on the patient's review from today.   Objective:   Physical Exam:  There were no vitals taken for this visit.  Physical Exam Constitutional:      General: He is not in acute distress.    Appearance: He is well-developed.  Musculoskeletal:        General: No deformity.  Neurological:     Mental Status: He is alert and oriented to person, place, and time.     Coordination: Coordination normal.  Psychiatric:        Attention and Perception: Attention normal. He is attentive.        Mood and Affect: Mood is anxious. Mood is not depressed. Affect is not angry or  inappropriate.        Speech: Speech normal.        Behavior: Behavior normal.        Thought Content: Thought content normal. Thought content is not delusional. Thought content does not include homicidal or suicidal ideation. Thought content does not include suicidal plan.        Cognition and Memory: Cognition normal.        Judgment: Judgment normal.     Comments: Insight is good. No new concerns     Lab Review:     Component Value Date/Time   NA 140 01/18/2018 1514  K 4.7 01/18/2018 1514   CL 99 01/18/2018 1514   CO2 21 01/18/2018 1514   GLUCOSE 95 01/18/2018 1514   GLUCOSE 85 01/04/2016 1414   BUN 13 01/18/2018 1514   CREATININE 0.97 01/18/2018 1514   CREATININE 0.84 01/04/2016 1414   CALCIUM 9.7 01/18/2018 1514   PROT 6.7 01/18/2018 1514   ALBUMIN 4.7 01/18/2018 1514   AST 31 01/18/2018 1514   ALT 53 (H) 01/18/2018 1514   ALKPHOS 58 01/18/2018 1514   BILITOT 0.6 01/18/2018 1514   GFRNONAA 92 01/18/2018 1514   GFRNONAA >60 10/31/2010 1455   GFRAA 106 01/18/2018 1514   GFRAA >60 10/31/2010 1455       Component Value Date/Time   WBC 8.3 01/18/2018 1514   WBC 6.6 01/04/2016 1414   RBC 5.09 01/18/2018 1514   RBC 5.40 01/04/2016 1414   HGB 15.3 01/18/2018 1514   HCT 44.3 01/18/2018 1514   PLT 284 01/18/2018 1514   MCV 87 01/18/2018 1514   MCH 30.1 01/18/2018 1514   MCH 29.4 01/04/2016 1414   MCHC 34.5 01/18/2018 1514   MCHC 33.6 01/04/2016 1414   RDW 12.1 (L) 01/18/2018 1514   LYMPHSABS 1.3 01/18/2018 1514   MONOABS 660 01/04/2016 1414   EOSABS 0.1 01/18/2018 1514   BASOSABS 0.1 01/18/2018 1514    No results found for: "POCLITH", "LITHIUM"   No results found for: "PHENYTOIN", "PHENOBARB", "VALPROATE", "CBMZ"   .res Assessment: Plan:    Gasper "Elana Grayer" was seen today for follow-up, anxiety and sleeping problem.  Diagnoses and all orders for this visit:  Social anxiety disorder -     diazepam  (VALIUM ) 5 MG tablet; Take 1 tablet (5 mg total) by  mouth in the morning, at noon, in the evening, and at bedtime.  Insomnia due to mental condition -     zolpidem  (AMBIEN  CR) 12.5 MG CR tablet; Take 1 tablet (12.5 mg total) by mouth at bedtime.  Obstructive sleep apnea    No med changes per his request.   HE prefers NAME BRAND ON VALIUM  but became unaffordable.  Generic was less effective. So had to increase to diazepam  5 mg QID.  Working well without SE  We discussed the short-term risks associated with benzodiazepines including sedation and increased fall risk among others.  Discussed long-term side effect risk including dependence, potential withdrawal symptoms, and the potential eventual dose-related risk of dementia.  But recent studies from 2020 dispute this association between benzodiazepines and dementia risk. Newer studies in 2020 do not support an association with dementia.   No evidence of abuse.  Sleeping well with zolpidem  CR 12.5 mg HS.  No px with agreeing to concealed carry permit.  Because he is stable for several years on the same medications, follow-up 1 year.  Nori Beat, MD, DFAPA   Please see After Visit Summary for patient specific instructions.  No future appointments.    No orders of the defined types were placed in this encounter.   -------------------------------

## 2023-10-04 ENCOUNTER — Other Ambulatory Visit (HOSPITAL_COMMUNITY): Payer: Self-pay

## 2023-12-12 ENCOUNTER — Other Ambulatory Visit: Payer: Self-pay | Admitting: Psychiatry

## 2023-12-12 DIAGNOSIS — F4010 Social phobia, unspecified: Secondary | ICD-10-CM

## 2023-12-12 DIAGNOSIS — F5105 Insomnia due to other mental disorder: Secondary | ICD-10-CM

## 2024-06-10 ENCOUNTER — Ambulatory Visit: Admitting: Psychiatry
